# Patient Record
Sex: Female | Born: 1980 | Race: White | Hispanic: No | Marital: Married | Smoking: Former smoker
Health system: Southern US, Community
[De-identification: ages and names within clinical notes are randomized; demographics above are authoritative.]

## PROBLEM LIST (undated history)

## (undated) ENCOUNTER — Inpatient Hospital Stay (HOSPITAL_COMMUNITY): Payer: Self-pay

## (undated) ENCOUNTER — Emergency Department (HOSPITAL_COMMUNITY): Discharge status: Absent without leave | Payer: Managed Care, Other (non HMO)

## (undated) DIAGNOSIS — O139 Gestational [pregnancy-induced] hypertension without significant proteinuria, unspecified trimester: Secondary | ICD-10-CM

## (undated) DIAGNOSIS — Z8619 Personal history of other infectious and parasitic diseases: Secondary | ICD-10-CM

## (undated) DIAGNOSIS — F419 Anxiety disorder, unspecified: Secondary | ICD-10-CM

## (undated) DIAGNOSIS — N189 Chronic kidney disease, unspecified: Secondary | ICD-10-CM

## (undated) DIAGNOSIS — L409 Psoriasis, unspecified: Secondary | ICD-10-CM

## (undated) HISTORY — DX: Anxiety disorder, unspecified: F41.9

## (undated) HISTORY — PX: WISDOM TOOTH EXTRACTION: SHX21

## (undated) HISTORY — DX: Psoriasis, unspecified: L40.9

## (undated) HISTORY — DX: Personal history of other infectious and parasitic diseases: Z86.19

## (undated) HISTORY — PX: OTHER SURGICAL HISTORY: SHX169

---

## 2014-01-25 ENCOUNTER — Inpatient Hospital Stay (HOSPITAL_COMMUNITY)
Admission: AD | Admit: 2014-01-25 | Discharge: 2014-01-25 | Disposition: A | Discharge status: Home / Self Care | Payer: Managed Care, Other (non HMO) | Source: Ambulatory Visit | Attending: Obstetrics and Gynecology | Admitting: Obstetrics and Gynecology

## 2014-01-25 ENCOUNTER — Inpatient Hospital Stay (HOSPITAL_COMMUNITY): Payer: Managed Care, Other (non HMO)

## 2014-01-25 ENCOUNTER — Encounter (HOSPITAL_COMMUNITY): Payer: Self-pay | Admitting: *Deleted

## 2014-01-25 DIAGNOSIS — M545 Low back pain, unspecified: Secondary | ICD-10-CM | POA: Insufficient documentation

## 2014-01-25 DIAGNOSIS — O99891 Other specified diseases and conditions complicating pregnancy: Secondary | ICD-10-CM | POA: Insufficient documentation

## 2014-01-25 DIAGNOSIS — R109 Unspecified abdominal pain: Secondary | ICD-10-CM | POA: Diagnosis not present

## 2014-01-25 DIAGNOSIS — O26891 Other specified pregnancy related conditions, first trimester: Secondary | ICD-10-CM

## 2014-01-25 DIAGNOSIS — O9989 Other specified diseases and conditions complicating pregnancy, childbirth and the puerperium: Principal | ICD-10-CM

## 2014-01-25 DIAGNOSIS — O0281 Inappropriate change in quantitative human chorionic gonadotropin (hCG) in early pregnancy: Secondary | ICD-10-CM

## 2014-01-25 LAB — URINALYSIS, ROUTINE W REFLEX MICROSCOPIC
Bilirubin Urine: NEGATIVE
GLUCOSE, UA: NEGATIVE mg/dL
Hgb urine dipstick: NEGATIVE
KETONES UR: NEGATIVE mg/dL
LEUKOCYTES UA: NEGATIVE
Nitrite: NEGATIVE
Protein, ur: NEGATIVE mg/dL
SPECIFIC GRAVITY, URINE: 1.01 (ref 1.005–1.030)
Urobilinogen, UA: 0.2 mg/dL (ref 0.0–1.0)
pH: 7 (ref 5.0–8.0)

## 2014-01-25 LAB — ABO/RH: ABO/RH(D): O POS

## 2014-01-25 LAB — POCT PREGNANCY, URINE: PREG TEST UR: POSITIVE — AB

## 2014-01-25 LAB — HCG, QUANTITATIVE, PREGNANCY: HCG, BETA CHAIN, QUANT, S: 1509 m[IU]/mL — AB (ref <5)

## 2014-01-25 NOTE — MAU Provider Note (Signed)
History     CSN: [id number]  Arrival date and time: 01/25/14 1412   None     Chief Complaint  Patient presents with   Back Pain   HPI 33 y.o. Carmen Castro @[redacted]w[redacted]d  presents today with 24 hours of Left lower back pain that radiates to her back and left leg.  She has occasional abdominal pain that is relieved if she burps or passes gas.  She has fatigue, dizziness, slight nausea, no vomiting.  No discharge or bleeding.  No LOF.    Tylenol did help her get some sleep last night.   OB History   Grav Para Term Preterm Abortions TAB SAB Ect Mult Living   2 1 1  0 0 0 0 0 0 1      History reviewed. No pertinent past medical history.  History reviewed. No pertinent past surgical history.  History reviewed. No pertinent family history.  History  Substance Use Topics   Smoking status: Never Smoker    Smokeless tobacco: Not on file   Alcohol Use: No    Allergies: No Known Allergies  Prescriptions prior to admission  Medication Sig Dispense Refill   acetaminophen (TYLENOL) 500 MG tablet Take 500 mg by mouth every 6 (six) hours as needed for moderate pain.       folic acid (FOLVITE) 800 MCG tablet Take 800 mcg by mouth daily.       polyethylene glycol (MIRALAX / GLYCOLAX) packet Take 17 g by mouth daily as needed for mild constipation.       Prenatal Vit-Fe Fumarate-FA (PRENATAL MULTIVITAMIN) TABS tablet Take 1 tablet by mouth daily at 12 noon.        Review of Systems  Constitutional: Negative for fever and chills.  Eyes: Negative for blurred vision.  Respiratory: Positive for shortness of breath. Negative for cough.   Cardiovascular: Negative for chest pain and palpitations.  Gastrointestinal: Positive for nausea. Negative for heartburn, vomiting and abdominal pain.  Genitourinary: Negative for dysuria, urgency, frequency and hematuria.  Skin: Negative for rash.  Neurological: Positive for dizziness and headaches. Negative for weakness.   Physical Exam   Blood  pressure 149/76, pulse 86, temperature 98.4 F (36.9 C), temperature source Oral, resp. rate 18, height 6' (1.829 m), weight 82.01 kg (180 lb 12.8 oz), last menstrual period 12/17/2013.  Physical Exam  Constitutional: She is oriented to person, place, and time. She appears well-developed and well-nourished. No distress.  HENT:  Head: Normocephalic and atraumatic.  Eyes: EOM are normal.  Neck: Normal range of motion.  Cardiovascular: Normal rate, regular rhythm and normal heart sounds.   Respiratory: Breath sounds normal. She is in respiratory distress.  GI: Soft. Bowel sounds are normal. She exhibits no distension. There is no tenderness.  Musculoskeletal: Normal range of motion. She exhibits no edema.  Neurological: She is alert and oriented to person, place, and time.  Skin: Skin is warm and dry.  Psychiatric: She has a normal mood and affect.   Results for orders placed during the hospital encounter of 01/25/14 (from the past 24 hour(s))  URINALYSIS, ROUTINE W REFLEX MICROSCOPIC     Status: None   Collection Time    01/25/14  2:50 PM      Result Value Ref Range   Color, Urine YELLOW  YELLOW   APPearance CLEAR  CLEAR   Specific Gravity, Urine 1.010  1.005 - 1.030   pH 7.0  5.0 - 8.0   Glucose, UA NEGATIVE  NEGATIVE mg/dL  Hgb urine dipstick NEGATIVE  NEGATIVE   Bilirubin Urine NEGATIVE  NEGATIVE   Ketones, ur NEGATIVE  NEGATIVE mg/dL   Protein, ur NEGATIVE  NEGATIVE mg/dL   Urobilinogen, UA 0.2  0.0 - 1.0 mg/dL   Nitrite NEGATIVE  NEGATIVE   Leukocytes, UA NEGATIVE  NEGATIVE  POCT PREGNANCY, URINE     Status: Abnormal   Collection Time    01/25/14  2:55 PM      Result Value Ref Range   Preg Test, Ur POSITIVE (*) NEGATIVE  HCG, QUANTITATIVE, PREGNANCY     Status: Abnormal   Collection Time    01/25/14  4:25 PM      Result Value Ref Range   hCG, Beta Chain, Quant, S 1509 (*) <5 mIU/mL  ABO/RH     Status: None   Collection Time    01/25/14  4:25 PM      Result Value Ref  Range   ABO/RH(D) O POS       MAU Course  Procedures  MDM Discussed with Dr. Arelia SneddonMcComb and given plan.  He prefers pt to come to office for serial quant.    Assessment and Plan  Assessment: 1. Low back pain during pregnancy in first trimester   2. Elevated level of quantitative hCG for gestational age in early pregnancy    Plan: Discharge to home Flexeril offered but pt declined Ice/heat alternating on left lower back Tylenol PRN not to exceed 3g/day Schedule appt with Dr. Lisbeth PlyMcComb's office for Friday to follow serial quant.    Duane BostonKaren E Teague Clark 01/25/2014, 3:41 PM

## 2014-01-25 NOTE — MAU Note (Signed)
About 6 wks preg.  Having severe pain on left side low back.  When moves left leg, shoots pain into low back. First apt 08/25. Was told to come here.

## 2014-01-25 NOTE — Discharge Instructions (Signed)

## 2014-02-14 LAB — OB RESULTS CONSOLE ABO/RH: RH Type: POSITIVE

## 2014-02-14 LAB — OB RESULTS CONSOLE HIV ANTIBODY (ROUTINE TESTING): HIV: NONREACTIVE

## 2014-02-14 LAB — OB RESULTS CONSOLE RUBELLA ANTIBODY, IGM: Rubella: UNDETERMINED

## 2014-02-14 LAB — OB RESULTS CONSOLE GC/CHLAMYDIA
Chlamydia: NEGATIVE
GC PROBE AMP, GENITAL: NEGATIVE

## 2014-02-14 LAB — OB RESULTS CONSOLE RPR: RPR: NONREACTIVE

## 2014-02-14 LAB — OB RESULTS CONSOLE HEPATITIS B SURFACE ANTIGEN: Hepatitis B Surface Ag: NEGATIVE

## 2014-02-14 LAB — OB RESULTS CONSOLE ANTIBODY SCREEN: ANTIBODY SCREEN: NEGATIVE

## 2014-04-18 ENCOUNTER — Encounter (HOSPITAL_COMMUNITY): Payer: Self-pay | Admitting: *Deleted

## 2014-07-18 ENCOUNTER — Encounter (HOSPITAL_COMMUNITY): Payer: Self-pay | Admitting: *Deleted

## 2014-07-18 ENCOUNTER — Inpatient Hospital Stay (HOSPITAL_COMMUNITY)
Admission: AD | Admit: 2014-07-18 | Discharge: 2014-07-18 | Disposition: A | Discharge status: Home / Self Care | Payer: Managed Care, Other (non HMO) | Source: Ambulatory Visit | Attending: Obstetrics and Gynecology | Admitting: Obstetrics and Gynecology

## 2014-07-18 DIAGNOSIS — Z87891 Personal history of nicotine dependence: Secondary | ICD-10-CM | POA: Insufficient documentation

## 2014-07-18 DIAGNOSIS — O368130 Decreased fetal movements, third trimester, not applicable or unspecified: Secondary | ICD-10-CM | POA: Diagnosis present

## 2014-07-18 DIAGNOSIS — O26899 Other specified pregnancy related conditions, unspecified trimester: Secondary | ICD-10-CM

## 2014-07-18 DIAGNOSIS — O9989 Other specified diseases and conditions complicating pregnancy, childbirth and the puerperium: Secondary | ICD-10-CM

## 2014-07-18 DIAGNOSIS — O2243 Hemorrhoids in pregnancy, third trimester: Secondary | ICD-10-CM | POA: Diagnosis not present

## 2014-07-18 DIAGNOSIS — Z3A30 30 weeks gestation of pregnancy: Secondary | ICD-10-CM | POA: Insufficient documentation

## 2014-07-18 DIAGNOSIS — R109 Unspecified abdominal pain: Secondary | ICD-10-CM

## 2014-07-18 LAB — URINALYSIS, ROUTINE W REFLEX MICROSCOPIC
Bilirubin Urine: NEGATIVE
Glucose, UA: NEGATIVE mg/dL
Hgb urine dipstick: NEGATIVE
Ketones, ur: NEGATIVE mg/dL
LEUKOCYTES UA: NEGATIVE
NITRITE: NEGATIVE
PH: 6 (ref 5.0–8.0)
PROTEIN: NEGATIVE mg/dL
Specific Gravity, Urine: 1.02 (ref 1.005–1.030)
Urobilinogen, UA: 0.2 mg/dL (ref 0.0–1.0)

## 2014-07-18 LAB — FETAL FIBRONECTIN: Fetal Fibronectin: NEGATIVE

## 2014-07-18 MED ORDER — ACETAMINOPHEN 500 MG PO TABS
1000.0000 mg | ORAL_TABLET | Freq: Once | ORAL | Status: AC
  Administered 2014-07-18: 1000 mg via ORAL
  Filled 2014-07-18: qty 2

## 2014-07-18 NOTE — MAU Note (Signed)
Tylenol resolved patients pain to 1of10. Pt states that she is now feeling the baby move.

## 2014-07-18 NOTE — MAU Note (Signed)
Hx of hemorrhoids, saw blood when used restroom, unsure if recal or vaginal

## 2014-07-18 NOTE — MAU Provider Note (Signed)
History     CSN: [id number]  Arrival date and time: 07/18/14 1843   None     Chief Complaint  Patient presents with  . Abdominal Pain  . Back Pain  . Decreased Fetal Movement   HPIpt is G2P1001  pregnant who presents for decreased fetal movement.  Pt states that baby is usually very active but today has not been.  Pt denies LOF.  Pt had one episode of scant blood with wiping - pt has hemorrhoids and pt thinks her blood was from hemorrhoid. Pt has back pain- has a 12 month old at home- pt has had some left lower quadrant cramping. Pt had induction for hypertension.  Pt has not had any issues with this pregnancy and had normal GTT.  Has been feeling balling up and pressure in lower abd today, pains in low back. Also baby is usually very active, not moving as much today History reviewed. No pertinent past medical history.  Past Surgical History  Procedure Laterality Date  . Wisdom tooth extraction      No family history on file.  History  Substance Use Topics  . Smoking status: Former Games developer  . Smokeless tobacco: Never Used  . Alcohol Use: No    Allergies: No Known Allergies  Prescriptions prior to admission  Medication Sig Dispense Refill Last Dose  . Phenylephrine-APAP-Guaifenesin 10-650-400 MG/20ML LIQD Take 20 mLs by mouth every 4 (four) hours as needed (For sinus pressure.).   07/17/2014 at Unknown time  . Prenatal Vit-Fe Fumarate-FA (PRENATAL MULTIVITAMIN) TABS tablet Take 1 tablet by mouth at bedtime.    07/17/2014 at Unknown time    Review of Systems  Constitutional: Negative for fever and chills.  Gastrointestinal: Positive for abdominal pain. Negative for nausea, vomiting and diarrhea.  Genitourinary: Positive for frequency. Negative for dysuria and urgency.  Musculoskeletal: Positive for back pain.  Neurological: Positive for dizziness. Negative for headaches.   Physical Exam   Blood pressure 146/80, pulse 93, temperature 98 F (36.7 C), temperature  source Oral, resp. rate 17, height 6' (1.829 m), weight 222 lb (100.699 kg), last menstrual period 12/17/2013.  Physical Exam  Vitals reviewed. Constitutional: She is oriented to person, place, and time. She appears well-developed and well-nourished. No distress.  HENT:  Head: Normocephalic.  Eyes: Pupils are equal, round, and reactive to light.  Neck: Normal range of motion. Neck supple.  Cardiovascular: Normal rate.   Recheck BP 126/70  Respiratory: Effort normal.  GI: Soft.  Genitourinary:  Small amount of mucous white vaginal discharge; cervix parous, FT, long, NT- no evidence of any blood  Musculoskeletal: Normal range of motion.  Neurological: She is alert and oriented to person, place, and time.  Skin: Skin is warm and dry.  Psychiatric: She has a normal mood and affect.    MAU Course  Procedures  Pt placed on monitor- still not feeling baby move LM for Dr. Renaldo Fiddler to call (inadvertantly- Dr. Marcelle Overlie on call) Dr. Marcelle Overlie contacted- pt has reactive NST- no ctx noted Results for orders placed or performed during the hospital encounter of 07/18/14 (from the past 24 hour(s))  Urinalysis, Routine w reflex microscopic     Status: None   Collection Time: 07/18/14  6:55 PM  Result Value Ref Range   Color, Urine YELLOW YELLOW   APPearance CLEAR CLEAR   Specific Gravity, Urine 1.020 1.005 - 1.030   pH 6.0 5.0 - 8.0   Glucose, UA NEGATIVE NEGATIVE mg/dL   Hgb urine dipstick NEGATIVE NEGATIVE  Bilirubin Urine NEGATIVE NEGATIVE   Ketones, ur NEGATIVE NEGATIVE mg/dL   Protein, ur NEGATIVE NEGATIVE mg/dL   Urobilinogen, UA 0.2 0.0 - 1.0 mg/dL   Nitrite NEGATIVE NEGATIVE   Leukocytes, UA NEGATIVE NEGATIVE  Fetal fibronectin     Status: None   Collection Time: 07/18/14  8:00 PM  Result Value Ref Range   Fetal Fibronectin NEGATIVE NEGATIVE  pt given apple juice and pt started feeling baby move more Tylenol given for back pain- some relief Discussed with pt comfort measures and  results of labs Will d/c pt and have do kick counts   Assessment and Plan  Abdominal pain in pregnancy- neg fetal fibronectin Decreased fetal movement F/u with OB appointment  Carmen Castro 07/18/2014, 7:34 PM

## 2014-07-18 NOTE — MAU Note (Signed)
Urine in lab 

## 2014-07-18 NOTE — MAU Note (Signed)
Has been feeling balling up and pressure in lower abd today, pains in low back.  Also baby is usually very active, not moving as much today.

## 2014-07-18 NOTE — Discharge Instructions (Signed)

## 2014-07-31 ENCOUNTER — Encounter (HOSPITAL_COMMUNITY): Payer: Self-pay | Admitting: *Deleted

## 2014-07-31 ENCOUNTER — Inpatient Hospital Stay (HOSPITAL_COMMUNITY)
Admission: AD | Admit: 2014-07-31 | Discharge: 2014-07-31 | Disposition: A | Discharge status: Home / Self Care | Payer: Managed Care, Other (non HMO) | Source: Ambulatory Visit | Attending: Obstetrics and Gynecology | Admitting: Obstetrics and Gynecology

## 2014-07-31 DIAGNOSIS — O9989 Other specified diseases and conditions complicating pregnancy, childbirth and the puerperium: Secondary | ICD-10-CM | POA: Insufficient documentation

## 2014-07-31 DIAGNOSIS — K529 Noninfective gastroenteritis and colitis, unspecified: Secondary | ICD-10-CM | POA: Insufficient documentation

## 2014-07-31 DIAGNOSIS — Z87891 Personal history of nicotine dependence: Secondary | ICD-10-CM | POA: Insufficient documentation

## 2014-07-31 DIAGNOSIS — O212 Late vomiting of pregnancy: Secondary | ICD-10-CM | POA: Diagnosis present

## 2014-07-31 DIAGNOSIS — Z3A32 32 weeks gestation of pregnancy: Secondary | ICD-10-CM | POA: Insufficient documentation

## 2014-07-31 HISTORY — DX: Chronic kidney disease, unspecified: N18.9

## 2014-07-31 HISTORY — DX: Gestational (pregnancy-induced) hypertension without significant proteinuria, unspecified trimester: O13.9

## 2014-07-31 LAB — URINALYSIS, ROUTINE W REFLEX MICROSCOPIC
Bilirubin Urine: NEGATIVE
GLUCOSE, UA: NEGATIVE mg/dL
Hgb urine dipstick: NEGATIVE
Ketones, ur: >80 mg/dL — AB
Leukocytes, UA: NEGATIVE
Nitrite: NEGATIVE
PROTEIN: NEGATIVE mg/dL
Specific Gravity, Urine: >1.03 — ABNORMAL HIGH (ref 1.005–1.030)
Urobilinogen, UA: 0.2 mg/dL (ref 0.0–1.0)
pH: 6 (ref 5.0–8.0)

## 2014-07-31 MED ORDER — DEXTROSE IN LACTATED RINGERS 5 % IV SOLN
Freq: Once | INTRAVENOUS | Status: AC
  Administered 2014-07-31: 17:00:00 via INTRAVENOUS

## 2014-07-31 MED ORDER — SODIUM CHLORIDE 0.9 % IV SOLN
25.0000 mg | Freq: Once | INTRAVENOUS | Status: AC
  Administered 2014-07-31: 25 mg via INTRAVENOUS
  Filled 2014-07-31: qty 1

## 2014-07-31 NOTE — MAU Provider Note (Signed)
  History     CSN: [id number]  Arrival date and time: 07/31/14 1500   First Provider Initiated Contact with Patient 07/31/14 1543      No chief complaint on file.  HPI This is a 34 y.o. female at 9361w2d who presents with c/o vomiting since 4am, about 10-12 times. Feels dehydrated. Tried Phenergan at home but vomited it up. Denies fever or diarrhea. Denies leaking, bleeding or contractions.   OB History    Gravida Para Term Preterm AB TAB SAB Ectopic Multiple Living   2 1 1  0 0 0 0 0 0 1      Past Medical History  Diagnosis Date  . Pregnancy induced hypertension     with first pregnancy  . Chronic kidney disease     uti's as a child    Past Surgical History  Procedure Laterality Date  . Wisdom tooth extraction      History reviewed. No pertinent family history.  History  Substance Use Topics  . Smoking status: Former Games developermoker  . Smokeless tobacco: Never Used  . Alcohol Use: No    Allergies: No Known Allergies  Prescriptions prior to admission  Medication Sig Dispense Refill Last Dose  . Phenylephrine-APAP-Guaifenesin 10-650-400 MG/20ML LIQD Take 20 mLs by mouth every 4 (four) hours as needed (For sinus pressure.).   Past Month at Unknown time  . Prenatal Vit-Fe Fumarate-FA (PRENATAL MULTIVITAMIN) TABS tablet Take 1 tablet by mouth at bedtime.    07/30/2014 at Unknown time  . promethazine (PHENERGAN) 25 MG tablet Take 25 mg by mouth every 6 (six) hours as needed for nausea or vomiting.   07/31/2014 at 1245    Review of Systems  Constitutional: Positive for malaise/fatigue. Negative for fever and chills.  Gastrointestinal: Positive for nausea, vomiting and abdominal pain (occasional cramps). Negative for diarrhea and constipation.  Neurological: Positive for weakness. Negative for dizziness.   Physical Exam   Blood pressure 117/63, pulse 149, temperature 99.2 F (37.3 C), temperature source Oral, resp. rate 16, last menstrual period 12/17/2013.  Physical Exam   Constitutional: She is oriented to person, place, and time. She appears well-developed and well-nourished. No distress (but ill-appearing).  HENT:  Head: Normocephalic.  Cardiovascular: Normal rate.   Respiratory: Effort normal.  GI: Soft. She exhibits no distension and no mass. There is no tenderness. There is no rebound and no guarding.  Musculoskeletal: Normal range of motion.  Neurological: She is alert and oriented to person, place, and time.  Skin: Skin is warm and dry.  Psychiatric: She has a normal mood and affect.    MAU Course  Procedures  MDM Given IV hydration x 2 liters with Phenergan. Felt much better afterward, though still a bit nauseated. Able to tolerate POs  Assessment and Plan  A:  SIUP at 5645w4d      Gastroenteritis  P;  Discharge home       BRAT diet       Keep using Phenergan prn.       Call if persists beyond a few days  [facility] TallahasseeWILLIAMS,MARIE 07/31/2014, 4:21 PM

## 2014-07-31 NOTE — Discharge Instructions (Signed)

## 2014-08-02 ENCOUNTER — Encounter (HOSPITAL_COMMUNITY): Payer: Self-pay | Admitting: Advanced Practice Midwife

## 2014-08-22 LAB — OB RESULTS CONSOLE GBS: STREP GROUP B AG: NEGATIVE

## 2014-09-07 ENCOUNTER — Telehealth (HOSPITAL_COMMUNITY): Payer: Self-pay | Admitting: *Deleted

## 2014-09-07 ENCOUNTER — Encounter (HOSPITAL_COMMUNITY): Payer: Self-pay | Admitting: *Deleted

## 2014-09-07 NOTE — Telephone Encounter (Signed)
Preadmission screen  

## 2014-09-13 ENCOUNTER — Encounter (HOSPITAL_COMMUNITY): Payer: Self-pay

## 2014-09-13 ENCOUNTER — Inpatient Hospital Stay (HOSPITAL_COMMUNITY)
Admission: RE | Admit: 2014-09-13 | Discharge: 2014-09-15 | DRG: 775 | Disposition: A | Discharge status: Home / Self Care | Payer: Managed Care, Other (non HMO) | Source: Ambulatory Visit | Attending: Obstetrics and Gynecology | Admitting: Obstetrics and Gynecology

## 2014-09-13 ENCOUNTER — Inpatient Hospital Stay (HOSPITAL_COMMUNITY): Payer: Managed Care, Other (non HMO) | Admitting: Anesthesiology

## 2014-09-13 DIAGNOSIS — O26833 Pregnancy related renal disease, third trimester: Secondary | ICD-10-CM | POA: Diagnosis present

## 2014-09-13 DIAGNOSIS — O3663X0 Maternal care for excessive fetal growth, third trimester, not applicable or unspecified: Secondary | ICD-10-CM | POA: Diagnosis present

## 2014-09-13 DIAGNOSIS — Z3490 Encounter for supervision of normal pregnancy, unspecified, unspecified trimester: Secondary | ICD-10-CM

## 2014-09-13 DIAGNOSIS — Z833 Family history of diabetes mellitus: Secondary | ICD-10-CM | POA: Diagnosis not present

## 2014-09-13 DIAGNOSIS — Z3A39 39 weeks gestation of pregnancy: Secondary | ICD-10-CM | POA: Diagnosis present

## 2014-09-13 DIAGNOSIS — N189 Chronic kidney disease, unspecified: Secondary | ICD-10-CM | POA: Diagnosis present

## 2014-09-13 DIAGNOSIS — Z87891 Personal history of nicotine dependence: Secondary | ICD-10-CM | POA: Diagnosis not present

## 2014-09-13 LAB — CBC
HCT: 36.8 % (ref 36.0–46.0)
HEMATOCRIT: 38.5 % (ref 36.0–46.0)
Hemoglobin: 13.1 g/dL (ref 12.0–15.0)
Hemoglobin: 12.5 g/dL (ref 12.0–15.0)
MCH: 31.3 pg (ref 26.0–34.0)
MCH: 31.2 pg (ref 26.0–34.0)
MCHC: 34 g/dL (ref 30.0–36.0)
MCHC: 34 g/dL (ref 30.0–36.0)
MCV: 92.1 fL (ref 78.0–100.0)
MCV: 91.8 fL (ref 78.0–100.0)
Platelets: 227 10*3/uL (ref 150–400)
Platelets: 191 10*3/uL (ref 150–400)
RBC: 4.18 MIL/uL (ref 3.87–5.11)
RBC: 4.01 MIL/uL (ref 3.87–5.11)
RDW: 14.6 % (ref 11.5–15.5)
RDW: 14.2 % (ref 11.5–15.5)
WBC: 12.7 10*3/uL — ABNORMAL HIGH (ref 4.0–10.5)
WBC: 19.1 10*3/uL — ABNORMAL HIGH (ref 4.0–10.5)

## 2014-09-13 LAB — TYPE AND SCREEN
ABO/RH(D): O POS
ANTIBODY SCREEN: NEGATIVE

## 2014-09-13 LAB — RPR: RPR: NONREACTIVE

## 2014-09-13 MED ORDER — FENTANYL 2.5 MCG/ML BUPIVACAINE 1/10 % EPIDURAL INFUSION (WH - ANES)
14.0000 mL/h | INTRAMUSCULAR | Status: DC | PRN
  Administered 2014-09-13 (×2): 14 mL/h via EPIDURAL
  Filled 2014-09-13 (×2): qty 125

## 2014-09-13 MED ORDER — DIBUCAINE 1 % RE OINT: 1.0000 "application " | TOPICAL_OINTMENT | RECTAL | Status: DC | PRN

## 2014-09-13 MED ORDER — ONDANSETRON HCL 4 MG/2ML IJ SOLN: 4.0000 mg | Freq: Four times a day (QID) | INTRAMUSCULAR | Status: DC | PRN

## 2014-09-13 MED ORDER — EPHEDRINE 5 MG/ML INJ
10.0000 mg | INTRAVENOUS | Status: DC | PRN
  Filled 2014-09-13: qty 2

## 2014-09-13 MED ORDER — ONDANSETRON HCL 4 MG PO TABS: 4.0000 mg | ORAL_TABLET | ORAL | Status: DC | PRN

## 2014-09-13 MED ORDER — DIPHENHYDRAMINE HCL 25 MG PO CAPS: 25.0000 mg | ORAL_CAPSULE | Freq: Four times a day (QID) | ORAL | Status: DC | PRN

## 2014-09-13 MED ORDER — PRENATAL MULTIVITAMIN CH
1.0000 | ORAL_TABLET | Freq: Every day | ORAL | Status: DC
  Administered 2014-09-14: 1 via ORAL
  Filled 2014-09-13: qty 1

## 2014-09-13 MED ORDER — OXYCODONE-ACETAMINOPHEN 5-325 MG PO TABS
1.0000 | ORAL_TABLET | ORAL | Status: DC | PRN
  Administered 2014-09-14 (×5): 1 via ORAL
  Filled 2014-09-13 (×5): qty 1

## 2014-09-13 MED ORDER — ACETAMINOPHEN 325 MG PO TABS: 650.0000 mg | ORAL_TABLET | ORAL | Status: DC | PRN

## 2014-09-13 MED ORDER — LIDOCAINE HCL (PF) 1 % IJ SOLN
INTRAMUSCULAR | Status: DC | PRN
  Administered 2014-09-13: 4 mL
  Administered 2014-09-13: 6 mL

## 2014-09-13 MED ORDER — ONDANSETRON HCL 4 MG/2ML IJ SOLN: 4.0000 mg | INTRAMUSCULAR | Status: DC | PRN

## 2014-09-13 MED ORDER — WITCH HAZEL-GLYCERIN EX PADS: 1.0000 "application " | MEDICATED_PAD | CUTANEOUS | Status: DC | PRN

## 2014-09-13 MED ORDER — IBUPROFEN 600 MG PO TABS
600.0000 mg | ORAL_TABLET | Freq: Four times a day (QID) | ORAL | Status: DC
  Administered 2014-09-13 – 2014-09-15 (×7): 600 mg via ORAL
  Filled 2014-09-13 (×7): qty 1

## 2014-09-13 MED ORDER — FLEET ENEMA 7-19 GM/118ML RE ENEM: 1.0000 | ENEMA | Freq: Every day | RECTAL | Status: DC | PRN

## 2014-09-13 MED ORDER — LACTATED RINGERS IV SOLN
INTRAVENOUS | Status: DC
  Administered 2014-09-13 (×2): via INTRAVENOUS

## 2014-09-13 MED ORDER — OXYTOCIN BOLUS FROM INFUSION: 500.0000 mL | INTRAVENOUS | Status: DC

## 2014-09-13 MED ORDER — LACTATED RINGERS IV SOLN
500.0000 mL | Freq: Once | INTRAVENOUS | Status: AC
  Administered 2014-09-13: 500 mL via INTRAVENOUS

## 2014-09-13 MED ORDER — OXYTOCIN 40 UNITS IN LACTATED RINGERS INFUSION - SIMPLE MED
1.0000 m[IU]/min | INTRAVENOUS | Status: DC
  Administered 2014-09-13: 2 m[IU]/min via INTRAVENOUS
  Filled 2014-09-13: qty 1000

## 2014-09-13 MED ORDER — TETANUS-DIPHTH-ACELL PERTUSSIS 5-2.5-18.5 LF-MCG/0.5 IM SUSP: 0.5000 mL | Freq: Once | INTRAMUSCULAR | Status: DC

## 2014-09-13 MED ORDER — DIPHENHYDRAMINE HCL 50 MG/ML IJ SOLN: 12.5000 mg | INTRAMUSCULAR | Status: DC | PRN

## 2014-09-13 MED ORDER — FENTANYL 2.5 MCG/ML BUPIVACAINE 1/10 % EPIDURAL INFUSION (WH - ANES): 14.0000 mL/h | INTRAMUSCULAR | Status: DC | PRN

## 2014-09-13 MED ORDER — LIDOCAINE HCL (PF) 1 % IJ SOLN
30.0000 mL | INTRAMUSCULAR | Status: DC | PRN
  Administered 2014-09-13: 30 mL via SUBCUTANEOUS
  Filled 2014-09-13: qty 30

## 2014-09-13 MED ORDER — OXYCODONE-ACETAMINOPHEN 5-325 MG PO TABS: 2.0000 | ORAL_TABLET | ORAL | Status: DC | PRN

## 2014-09-13 MED ORDER — TERBUTALINE SULFATE 1 MG/ML IJ SOLN
0.2500 mg | Freq: Once | INTRAMUSCULAR | Status: AC | PRN
  Filled 2014-09-13: qty 1

## 2014-09-13 MED ORDER — LACTATED RINGERS IV SOLN: 500.0000 mL | INTRAVENOUS | Status: DC | PRN

## 2014-09-13 MED ORDER — PHENYLEPHRINE 40 MCG/ML (10ML) SYRINGE FOR IV PUSH (FOR BLOOD PRESSURE SUPPORT)
80.0000 ug | PREFILLED_SYRINGE | INTRAVENOUS | Status: DC | PRN
  Filled 2014-09-13: qty 2
  Filled 2014-09-13: qty 20

## 2014-09-13 MED ORDER — OXYTOCIN 40 UNITS IN LACTATED RINGERS INFUSION - SIMPLE MED
62.5000 mL/h | INTRAVENOUS | Status: DC
  Administered 2014-09-13: 62.5 mL/h via INTRAVENOUS

## 2014-09-13 MED ORDER — BISACODYL 10 MG RE SUPP: 10.0000 mg | Freq: Every day | RECTAL | Status: DC | PRN

## 2014-09-13 MED ORDER — LANOLIN HYDROUS EX OINT: TOPICAL_OINTMENT | CUTANEOUS | Status: DC | PRN

## 2014-09-13 MED ORDER — TERBUTALINE SULFATE 1 MG/ML IJ SOLN
0.2500 mg | Freq: Once | INTRAMUSCULAR | Status: DC | PRN
  Filled 2014-09-13: qty 1

## 2014-09-13 MED ORDER — CITRIC ACID-SODIUM CITRATE 334-500 MG/5ML PO SOLN: 30.0000 mL | ORAL | Status: DC | PRN

## 2014-09-13 MED ORDER — BENZOCAINE-MENTHOL 20-0.5 % EX AERO
1.0000 "application " | INHALATION_SPRAY | CUTANEOUS | Status: DC | PRN
  Administered 2014-09-13: 1 via TOPICAL
  Filled 2014-09-13: qty 56

## 2014-09-13 MED ORDER — SENNOSIDES-DOCUSATE SODIUM 8.6-50 MG PO TABS
2.0000 | ORAL_TABLET | ORAL | Status: DC
  Administered 2014-09-14: 2 via ORAL
  Filled 2014-09-13 (×2): qty 2

## 2014-09-13 MED ORDER — OXYTOCIN 40 UNITS IN LACTATED RINGERS INFUSION - SIMPLE MED: 1.0000 m[IU]/min | INTRAVENOUS | Status: DC

## 2014-09-13 MED ORDER — SIMETHICONE 80 MG PO CHEW
80.0000 mg | CHEWABLE_TABLET | ORAL | Status: DC | PRN
  Administered 2014-09-14: 80 mg via ORAL
  Filled 2014-09-13: qty 1

## 2014-09-13 MED ORDER — ZOLPIDEM TARTRATE 5 MG PO TABS: 5.0000 mg | ORAL_TABLET | Freq: Every evening | ORAL | Status: DC | PRN

## 2014-09-13 MED ORDER — OXYCODONE-ACETAMINOPHEN 5-325 MG PO TABS
1.0000 | ORAL_TABLET | ORAL | Status: DC | PRN
Start: 2014-09-13 — End: 2014-09-13

## 2014-09-13 MED ORDER — PHENYLEPHRINE 40 MCG/ML (10ML) SYRINGE FOR IV PUSH (FOR BLOOD PRESSURE SUPPORT)
80.0000 ug | PREFILLED_SYRINGE | INTRAVENOUS | Status: DC | PRN
  Filled 2014-09-13: qty 2

## 2014-09-13 NOTE — H&P (Signed)
Carmen Castro is a 34 y.o. female presenting for IOL at 39 weeks for large efw.  Last sonogram 8.3 pounds.  favorabale ceevix.  Negative GBS. Maternal Medical History:  Fetal activity: Perceived fetal activity is normal.    Prenatal complications: no prenatal complications Prenatal Complications - Diabetes: none.    OB History    Gravida Para Term Preterm AB TAB SAB Ectopic Multiple Living   2 1 1  0 0 0 0 0 0 1     Past Medical History  Diagnosis Date  . Pregnancy induced hypertension     with first pregnancy  . Chronic kidney disease     uti's as a child  . Hx of varicella   . Psoriasis   . Anxiety     mild pp no meds   Past Surgical History  Procedure Laterality Date  . Wisdom tooth extraction    . Urethra stretched      x2   Family History: family history includes Cancer in her maternal grandmother; Diabetes in her maternal uncle; Thyroid disease in her mother. Social History:  reports that she has quit smoking. She has never used smokeless tobacco. She reports that she does not drink alcohol or use illicit drugs.   Prenatal Transfer Tool  Maternal Diabetes: No Genetic Screening: Normal Maternal Ultrasounds/Referrals: Abnormal:  Findings:   Fetal Kidney Anomalies pylectasis Fetal Ultrasounds or other Referrals:  None Maternal Substance Abuse:  No Significant Maternal Medications:  None Significant Maternal Lab Results:  None Other Comments:  None  ROS    Blood pressure 139/86, pulse 99, resp. rate 18, height 6' (1.829 m), weight 235 lb (106.595 kg), last menstrual period 12/17/2013. Maternal Exam:  Uterine Assessment: Contraction strength is mild.  Contraction frequency is irregular.   Abdomen: Patient reports no abdominal tenderness. Estimated fetal weight is 8.3.   Fetal presentation: vertex  Introitus: Amniotic fluid character: clear.  Pelvis: adequate for delivery.   Cervix: Cervix evaluated by digital exam.   3cm and long vtx -2  Fetal Exam Fetal  State Assessment: Category I - tracings are normal.     Physical Exam  Prenatal labs: ABO, Rh: O/Positive/-- (09/01 0000) Antibody: Negative (09/01 0000) Rubella: Equivocal (09/01 0000) RPR: Nonreactive (09/01 0000)  HBsAg: Negative (09/01 0000)  HIV: Non-reactive (09/01 0000)  GBS:     Assessment/Plan: IUP at 39 large efw for IOL Risk of pitocin discussed   Carmen Castro S 09/13/2014, 8:58 AM

## 2014-09-13 NOTE — Progress Notes (Signed)
Delivery of live viable female by Dr Arelia SneddonMcComb. APGARS 9,9

## 2014-09-13 NOTE — Anesthesia Preprocedure Evaluation (Signed)
Anesthesia Evaluation  Patient identified by MRN, date of birth, ID band Patient awake    Reviewed: Allergy & Precautions, H&P , Patient's Chart, lab work & pertinent test results  Airway Mallampati: II  TM Distance: >3 FB Neck ROM: full    Dental  (+) Teeth Intact   Pulmonary former smoker,  breath sounds clear to auscultation        Cardiovascular hypertension, Rhythm:regular Rate:Normal     Neuro/Psych    GI/Hepatic   Endo/Other    Renal/GU      Musculoskeletal   Abdominal   Peds  Hematology   Anesthesia Other Findings   Pregnancy induced hypertension    Reproductive/Obstetrics (+) Pregnancy                             Anesthesia Physical Anesthesia Plan  ASA: II  Anesthesia Plan: Epidural   Post-op Pain Management:    Induction:   Airway Management Planned:   Additional Equipment:   Intra-op Plan:   Post-operative Plan:   Informed Consent: I have reviewed the patients History and Physical, chart, labs and discussed the procedure including the risks, benefits and alternatives for the proposed anesthesia with the patient or authorized representative who has indicated his/her understanding and acceptance.   Dental Advisory Given  Plan Discussed with:   Anesthesia Plan Comments: (Labs checked- platelets confirmed with RN in room. Fetal heart tracing, per RN, reported to be stable enough for sitting procedure. Discussed epidural, and patient consents to the procedure:  included risk of possible headache,backache, failed block, allergic reaction, and nerve injury. This patient was asked if she had any questions or concerns before the procedure started.)        Anesthesia Quick Evaluation

## 2014-09-13 NOTE — Anesthesia Procedure Notes (Signed)

## 2014-09-14 ENCOUNTER — Encounter (HOSPITAL_COMMUNITY): Payer: Self-pay

## 2014-09-14 ENCOUNTER — Inpatient Hospital Stay (HOSPITAL_COMMUNITY): Admission: RE | Admit: 2014-09-14 | Payer: Managed Care, Other (non HMO) | Source: Ambulatory Visit

## 2014-09-14 LAB — CBC
HCT: 33.5 % — ABNORMAL LOW (ref 36.0–46.0)
Hemoglobin: 11.5 g/dL — ABNORMAL LOW (ref 12.0–15.0)
MCH: 31.6 pg (ref 26.0–34.0)
MCHC: 34.3 g/dL (ref 30.0–36.0)
MCV: 92 fL (ref 78.0–100.0)
PLATELETS: 175 10*3/uL (ref 150–400)
RBC: 3.64 MIL/uL — ABNORMAL LOW (ref 3.87–5.11)
RDW: 14.4 % (ref 11.5–15.5)
WBC: 16.2 10*3/uL — ABNORMAL HIGH (ref 4.0–10.5)

## 2014-09-14 NOTE — Progress Notes (Signed)
Post Partum Day 1 Subjective: no complaints, up ad lib, voiding and tolerating PO  Objective: Blood pressure 118/52, pulse 80, temperature 98.7 F (37.1 C), temperature source Oral, resp. rate 18, height 6' (1.829 m), weight 235 lb (106.595 kg), last menstrual period 12/17/2013, SpO2 99 %, unknown if currently breastfeeding.  Physical Exam:  General: alert and cooperative Lochia: appropriate Uterine Fundus: firm Incision: healing well DVT Evaluation: No evidence of DVT seen on physical exam. Negative Homan's sign. No cords or calf tenderness. No significant calf/ankle edema.   Recent Labs  09/13/14 1905 09/14/14 0535  HGB 12.5 11.5*  HCT 36.8 33.5*    Assessment/Plan: Plan for discharge tomorrow and Circumcision prior to discharge   LOS: 1 day   CURTIS,CAROL G 09/14/2014, 8:02 AM

## 2014-09-14 NOTE — Anesthesia Postprocedure Evaluation (Signed)
  Anesthesia Post Note  Patient: Carmen Castro  Procedure(s) Performed: * No procedures listed *  Anesthesia type: Epidural  Patient location: Mother/Baby  Post pain: Pain level controlled  Post assessment: Post-op Vital signs reviewed  Last Vitals:  Filed Vitals:   09/14/14 0200  BP: 118/52  Pulse: 80  Temp: 37.1 C  Resp: 18    Post vital signs: Reviewed  Level of consciousness:alert  Complications: No apparent anesthesia complications

## 2014-09-14 NOTE — Lactation Note (Signed)
This note was copied from the chart of Carmen Castro Ruta. Lactation Consultation Note Experienced BF mom BF her first child for 12 months. She weaned her first child 9 months ago. She states that BF is going great with her newborn. Mom has a pump at home. Baby just had his bath and is doing STS and sleeping. Discussed positions w/newborns, hearing swallows, stimulation to BF, I&O, benefits of colostrum. Mom stated she had a huge amount of milk with her first child. Discussed forceful letdown and engorgement management.  Mom encouraged to feed baby 8-12 times/24 hours and with feeding cues. Mom encouraged to do skin-to-skin. Mom encouraged to waken baby for feeds.  Educated about newborn behavior. Encouraged comfort during BF so colostrum flows better and mom will enjoy the feeding longer. Taking deep breaths and breast massage during BF. Referred to Baby and Me Book in Breastfeeding section Pg. 22-23 for position options and Proper latch demonstration. WH/LC brochure given w/resources, support groups and LC services. Mom is excited about the support groups and will try to attend some.  Patient Name: Carmen Castro Hilmer [id number]'s Date: 09/14/2014 Reason for consult: Initial assessment   Maternal Data Has patient been taught Hand Expression?: Yes Does the patient have breastfeeding experience prior to this delivery?: Yes  Feeding    LATCH Score/Interventions                      Lactation Tools Discussed/Used     Consult Status Consult Status: Follow-up Date: 09/15/14 Follow-up type: In-patient    Charyl DancerCARVER, Inetha Maret G 09/14/2014, 5:37 AM

## 2014-09-14 NOTE — Progress Notes (Signed)
Patient counseled for circ including risk of bleeding, infection, and scarring.  All questions were answered and patient wishes to proceed.    Mitchel HonourMegan Brietta Manso, DO

## 2014-09-15 MED ORDER — OXYCODONE-ACETAMINOPHEN 5-325 MG PO TABS: 1.0000 | ORAL_TABLET | ORAL | Status: DC | PRN

## 2014-09-15 MED ORDER — IBUPROFEN 600 MG PO TABS: 600.0000 mg | ORAL_TABLET | Freq: Four times a day (QID) | ORAL | Status: DC

## 2014-09-15 MED ORDER — MEASLES, MUMPS & RUBELLA VAC ~~LOC~~ INJ
0.5000 mL | INJECTION | Freq: Once | SUBCUTANEOUS | Status: AC
  Administered 2014-09-15: 0.5 mL via SUBCUTANEOUS
  Filled 2014-09-15 (×2): qty 0.5

## 2014-09-15 NOTE — Progress Notes (Signed)
MOB was referred for history of depression/anxiety.  Referral is screened out by Clinical Social Worker because none of the following criteria appear to apply: -History of anxiety/depression during this pregnancy, or of post-partum depression. - Diagnosis of anxiety and/or depression within last 3 years - History of depression due to pregnancy loss/loss of child or -MOB's symptoms are currently being treated with medication and/or therapy.  CSW briefly met with MOB and FOB, but did not complete full assessment.   MOB smiled and displayed a full range in affect as she expressed excitement about returning home.  She stated that she experienced mild postpartum anxiety, but she was informed by her provider that her anxiety was within normal range for a new mother. MOB denied need for treatment after her first child was born. She denied anxiety during the pregnancy and denied any anxiety as she prepares to discharge home. She stated that she has strong/postive family support, and agreed to contact medical provider if postpartum depression/anxiety occur.  Please contact the Clinical Social Worker if needs arise or upon MOB request.  

## 2014-09-15 NOTE — Discharge Summary (Signed)
Obstetric Discharge Summary Reason for Admission: induction of labor Prenatal Procedures: ultrasound Intrapartum Procedures: spontaneous vaginal delivery Postpartum Procedures: none Complications-Operative and Postpartum: 2 degree perineal laceration HEMOGLOBIN  Date Value Ref Range Status  09/14/2014 11.5* 12.0 - 15.0 g/dL Final   HCT  Date Value Ref Range Status  09/14/2014 33.5* 36.0 - 46.0 % Final    Physical Exam:  General: alert and cooperative Lochia: appropriate Uterine Fundus: firm Incision: healing well DVT Evaluation: No evidence of DVT seen on physical exam. Negative Homan's sign. No cords or calf tenderness. No significant calf/ankle edema.  Discharge Diagnoses: Term Pregnancy-delivered  Discharge Information: Date: 09/15/2014 Activity: pelvic rest Diet: routine Medications: PNV, Ibuprofen, Colace and Percocet Condition: stable Instructions: refer to practice specific booklet Discharge to: home   Newborn Data: Live born female  Birth Weight: 8 lb 13.8 oz (4020 g) APGAR: 9, 9  Home with mother.  Meris Reede G 09/15/2014, 8:40 AM

## 2014-09-23 ENCOUNTER — Inpatient Hospital Stay (HOSPITAL_COMMUNITY)
Admission: AD | Admit: 2014-09-23 | Payer: Managed Care, Other (non HMO) | Source: Ambulatory Visit | Admitting: Obstetrics and Gynecology

## 2015-02-23 IMAGING — US US OB COMP LESS 14 WK
1 series · 14 of 28 positions shown · non-contrast
Comparison: None.

CLINICAL DATA: Confirm IUP cine: Beta HCG 06/24/2007; left lower
quadrant pain since last night ; last menstrual period December 17, 2013

EXAM:
OBSTETRIC <14 WK US AND TRANSVAGINAL OB US
TECHNIQUE: Both transabdominal and transvaginal ultrasound examinations were
performed for complete evaluation of the gestation as well as the
maternal uterus, adnexal regions, and pelvic cul-de-sac.
Transvaginal technique was performed to assess early pregnancy.

[Series 1: us ob comp less 14 wks · 14 of 48 slices shown]
[im 2/48]
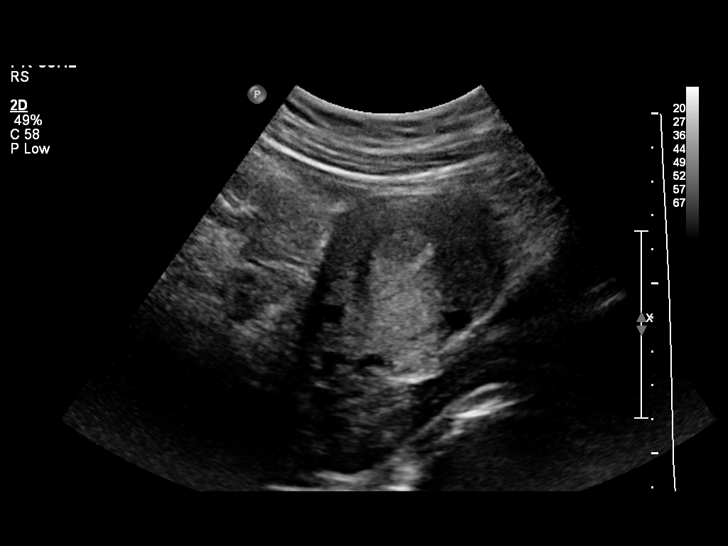
[im 6/48]
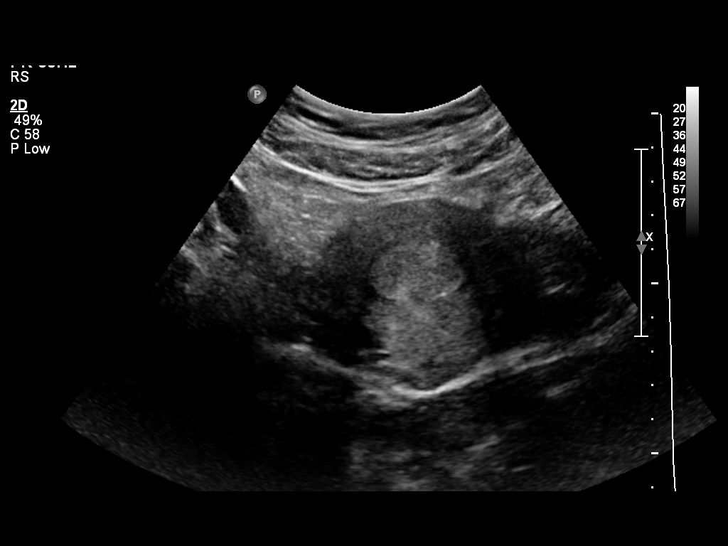
[im 9/48]
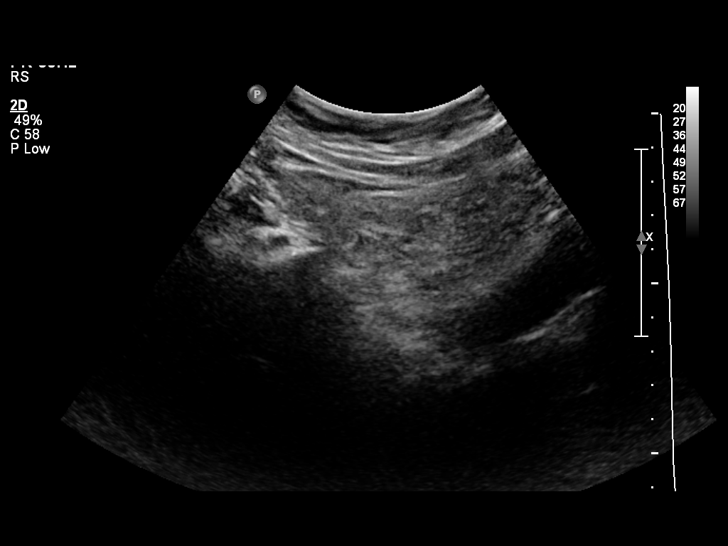
[im 13/48]
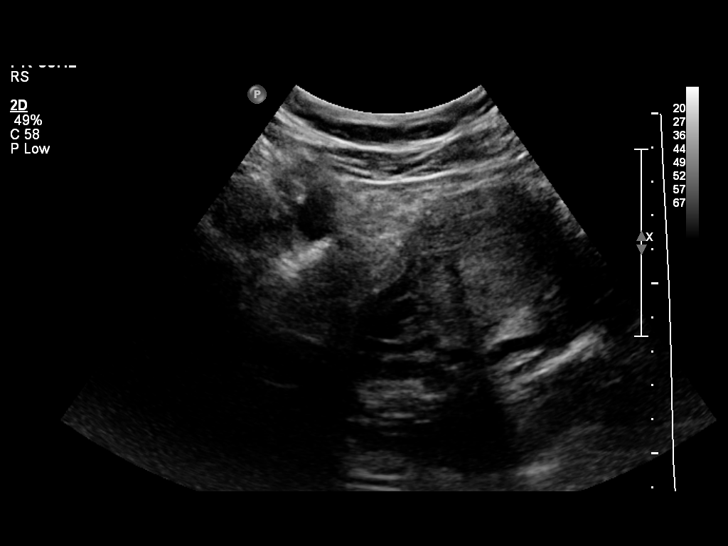
[im 16/48]
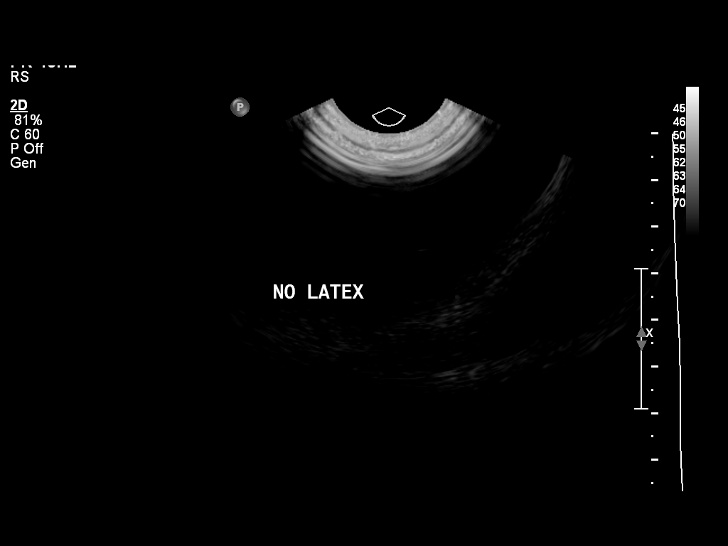
[im 20/48]
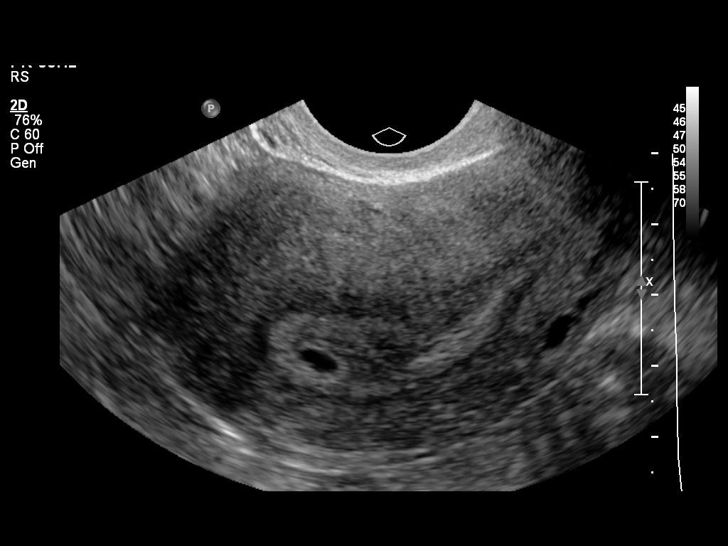
[im 23/48]
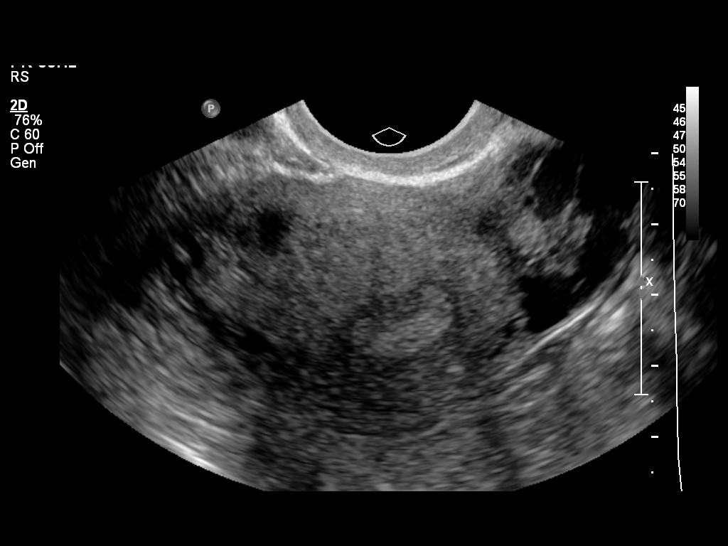
[im 27/48]
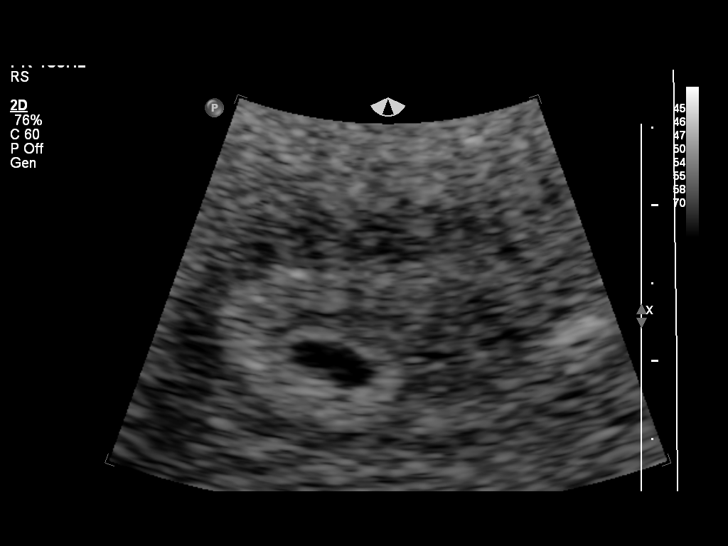
[im 30/48]
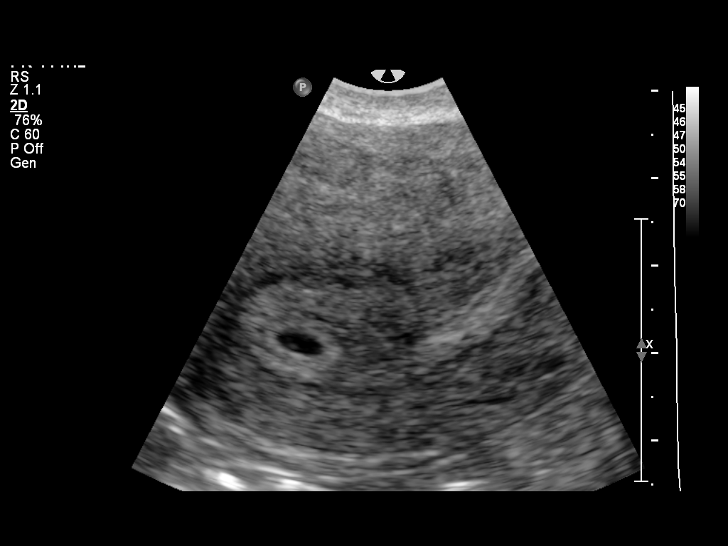
[im 34/48]
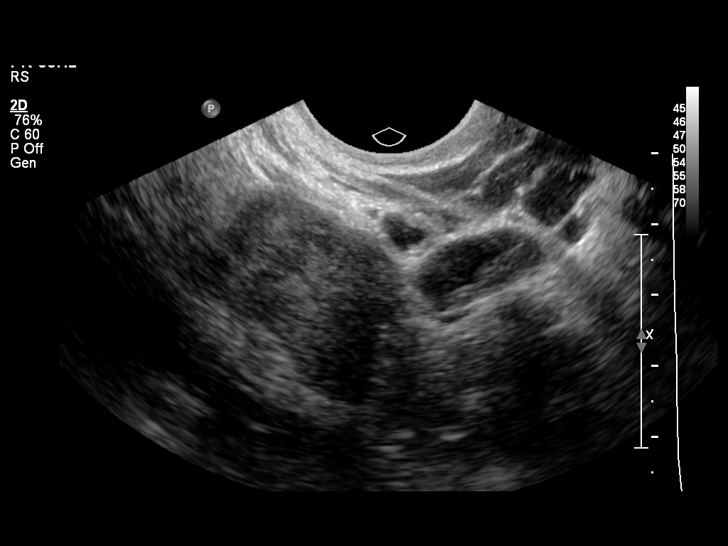
[im 37/48]
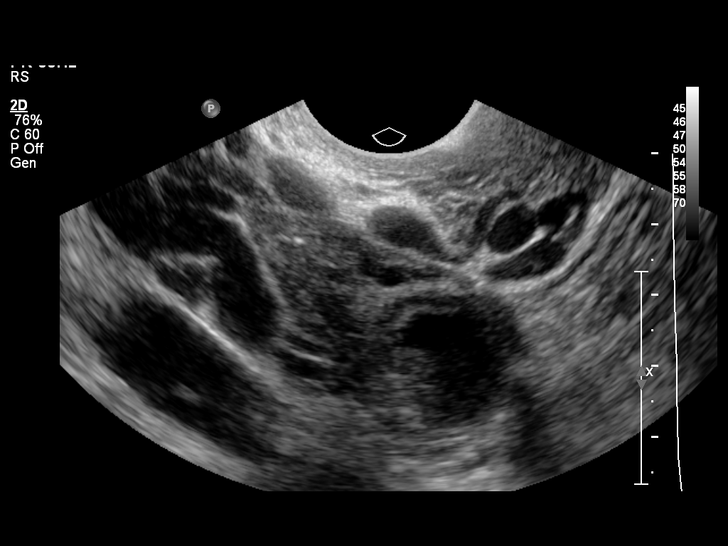
[im 41/48]
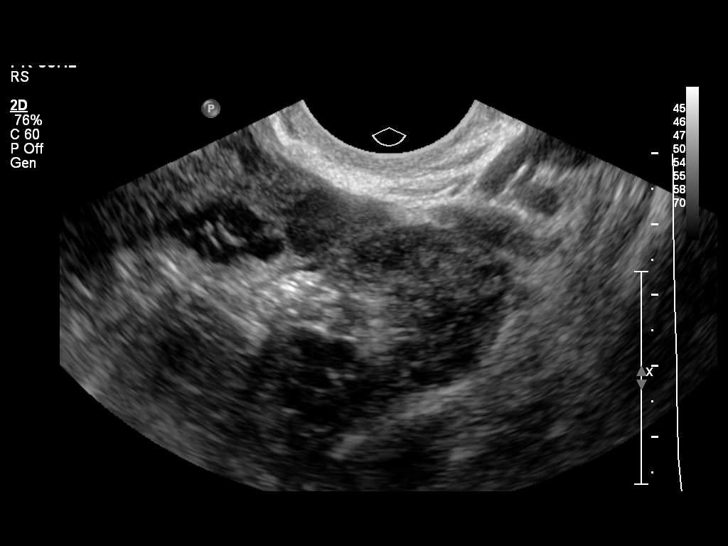
[im 44/48]
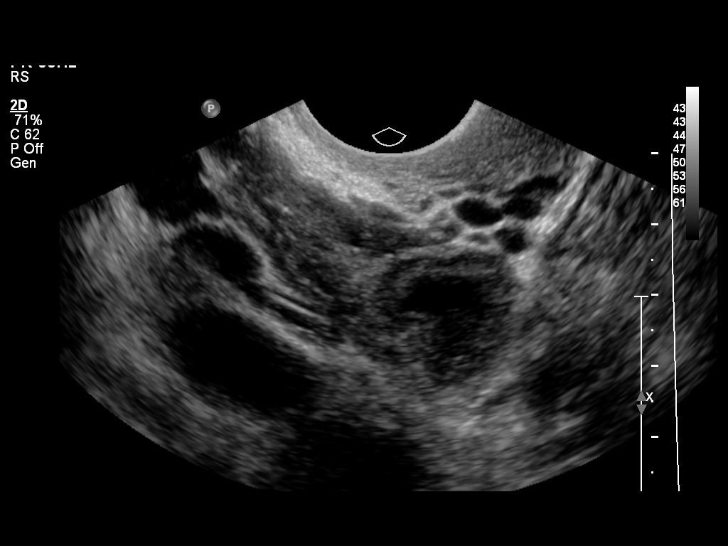
[im 48/48]
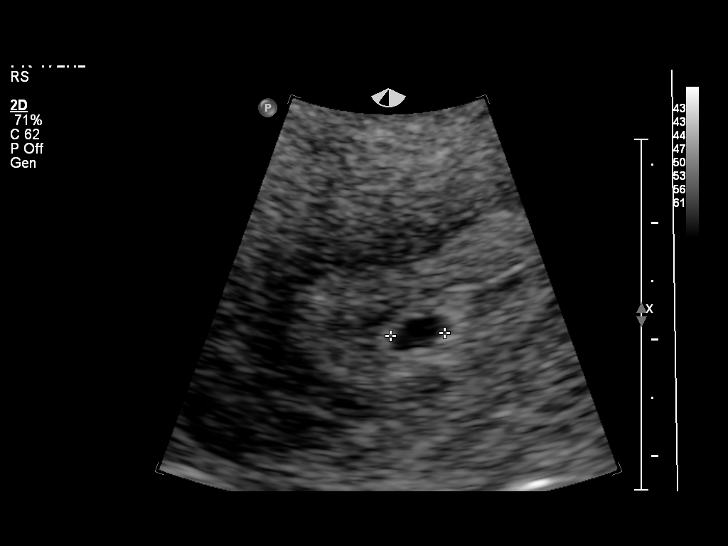

[14 of 28 positions shown; findings below may reference images not displayed]

FINDINGS: Intrauterine gestational sac: Single

Yolk sac:  Probable

Embryo:  Not visualized

Cardiac Activity: Not visualized

MSD:  46  mm   5 w   0  d

US EDC: September 26, 2013

Maternal uterus/adnexae: No subchorionic hemorrhage is demonstrated.
There are no abnormal adnexal masses demonstrated.
IMPRESSION: There may be a very early gestational sac present but no fetal pole
is demonstrated currently. The presence of a yolk sac is
indeterminate. No suspicious adnexal masses are demonstrated.

Serial follow-up beta HCG determinations and pelvic ultrasound
examinations will be needed.

## 2015-05-02 ENCOUNTER — Ambulatory Visit (INDEPENDENT_AMBULATORY_CARE_PROVIDER_SITE_OTHER): Payer: Managed Care, Other (non HMO) | Admitting: Family Medicine

## 2015-05-02 ENCOUNTER — Encounter: Payer: Self-pay | Admitting: Family Medicine

## 2015-05-02 VITALS — BP 118/68 | HR 72 | Ht 72.0 in | Wt 215.4 lb

## 2015-05-02 DIAGNOSIS — Z7689 Persons encountering health services in other specified circumstances: Secondary | ICD-10-CM

## 2015-05-02 DIAGNOSIS — Z7189 Other specified counseling: Secondary | ICD-10-CM

## 2015-05-02 DIAGNOSIS — R635 Abnormal weight gain: Secondary | ICD-10-CM | POA: Diagnosis not present

## 2015-05-02 DIAGNOSIS — Z713 Dietary counseling and surveillance: Secondary | ICD-10-CM

## 2015-05-02 LAB — COMPREHENSIVE METABOLIC PANEL
ALT: 9 U/L (ref 6–29)
AST: 15 U/L (ref 10–30)
Albumin: 4.2 g/dL (ref 3.6–5.1)
Alkaline Phosphatase: 38 U/L (ref 33–115)
BILIRUBIN TOTAL: 0.4 mg/dL (ref 0.2–1.2)
BUN: 14 mg/dL (ref 7–25)
CALCIUM: 9 mg/dL (ref 8.6–10.2)
CO2: 26 mmol/L (ref 20–31)
Chloride: 104 mmol/L (ref 98–110)
Creat: 0.69 mg/dL (ref 0.50–1.10)
Glucose, Bld: 86 mg/dL (ref 65–99)
Potassium: 3.7 mmol/L (ref 3.5–5.3)
SODIUM: 139 mmol/L (ref 135–146)
Total Protein: 7 g/dL (ref 6.1–8.1)

## 2015-05-02 LAB — CBC WITH DIFFERENTIAL/PLATELET
BASOS ABS: 0 10*3/uL (ref 0.0–0.1)
Basophils Relative: 0 % (ref 0–1)
Eosinophils Absolute: 0.1 10*3/uL (ref 0.0–0.7)
Eosinophils Relative: 1 % (ref 0–5)
HCT: 40.4 % (ref 36.0–46.0)
Hemoglobin: 13.3 g/dL (ref 12.0–15.0)
LYMPHS ABS: 2.9 10*3/uL (ref 0.7–4.0)
Lymphocytes Relative: 39 % (ref 12–46)
MCH: 30.9 pg (ref 26.0–34.0)
MCHC: 32.9 g/dL (ref 30.0–36.0)
MCV: 94 fL (ref 78.0–100.0)
MPV: 8.9 fL (ref 8.6–12.4)
Monocytes Absolute: 0.3 10*3/uL (ref 0.1–1.0)
Monocytes Relative: 4 % (ref 3–12)
NEUTROS PCT: 56 % (ref 43–77)
Neutro Abs: 4.2 10*3/uL (ref 1.7–7.7)
Platelets: 295 10*3/uL (ref 150–400)
RBC: 4.3 MIL/uL (ref 3.87–5.11)
RDW: 13.5 % (ref 11.5–15.5)
WBC: 7.5 10*3/uL (ref 4.0–10.5)

## 2015-05-02 LAB — HEMOGLOBIN A1C
Hgb A1c MFr Bld: 5.4 % (ref <5.7)
MEAN PLASMA GLUCOSE: 108 mg/dL (ref <117)

## 2015-05-02 NOTE — Progress Notes (Signed)
   Subjective:    Patient ID: Carmen Castro, female    DOB: 10-22-1980, 34 y.o.   MRN: [id number]  HPI  She is here to establish primary care. Has not had a primary care provider in the past few years. States she has been seen OB/GYN history has been pregnant twice in the past 2 and half years. She moved here 2 years ago. Is here today for acute visit and requesting weight loss help. After delivery in March she lost 25 lbs and has gained 10 lbs back.  Last labs during pregnancy.  Has not seen a nutritionist and is not interested in seeing one. She states she has been working very hard at eating healthy and exercising and is unable to lose weight. In fact she states she has been gaining weight in spite of doing these things. She states coming here and requesting medication as a last resort for her because she knows that weight loss is a lifestyle problem however she feels like she needs a boost.   Has been pregnant for past 2 years and gained approximately 50 lbs with her first child and had no difficulty losing her pregnancy weight. States she had greater difficulty losing the weight after her second delivery. 5-6 days per week elliptical and weight training, spin class, pilates.  "Hungry all the time" eating 2000-2200 calories per day.   Weight watchers- states she gained weight while doing this. Has tried using my fitness pal in past.   Has 2 kids, 2 years, and 7 month. Is no longer breast feeding.  Goes to [facility]- had pap 09/2014- normal   Elevated blood pressure during pregnancies. States otherwise blood pressures normal. No gestational diabetes.   Probiotic daily- constipation and thinks it is tied to monthly cycles. Has used Miralax in past.  Zyrtec- saw allergist, has environmental allergies.  Zoloft- started on it after last birth, had PPD- "rage" and is doing well on this.   Drinks daily glass of wine 6 ounces.  Does not smoke, denies drug.   Allergies, medications,  past medical, surgical, and social history.   Review of Systems Pertinent positives and negatives in the history of present illness.    Objective:   Physical Exam  Alert and oriented and in no acute distress. Not otherwise examined.     Assessment & Plan:  Encounter for weight loss counseling - Plan: CBC with Differential/Platelet, Comprehensive metabolic panel, TSH, Hemoglobin A1c  Encounter to establish care  Weight gain - Plan: CBC with Differential/Platelet, Comprehensive metabolic panel, TSH, Hemoglobin A1c  Discussed that we will check blood work to rule out any underlying physiologic etiology keeping her from losing weight. I would like to get her records from her OB/GYN. Discussed that medication for weight loss has risks and side effects and she must be aware of those. Discussed that her history of hypertension during pregnancy is a concern in regards to weight loss medicine. Strongly encouraged her to schedule appointment with the nutritionist since she has not done this in the past however she refuses to do this. She states she feels like she knows how to eat healthy. She will follow up in 2 weeks and we will once again discuss weight loss medication and potentially start one of the medications at that time.  Spent a minimum of 30 minutes face-to-face with patient and 50% of that was in counseling.

## 2015-05-02 NOTE — Patient Instructions (Signed)

## 2015-05-03 LAB — TSH: TSH: 1.751 u[IU]/mL (ref 0.350–4.500)

## 2015-05-14 ENCOUNTER — Encounter: Payer: Self-pay | Admitting: Family Medicine

## 2015-05-14 ENCOUNTER — Ambulatory Visit (INDEPENDENT_AMBULATORY_CARE_PROVIDER_SITE_OTHER): Payer: Managed Care, Other (non HMO) | Admitting: Family Medicine

## 2015-05-14 VITALS — BP 118/78 | HR 65 | Ht 72.0 in | Wt 214.4 lb

## 2015-05-14 DIAGNOSIS — F418 Other specified anxiety disorders: Secondary | ICD-10-CM

## 2015-05-14 DIAGNOSIS — E663 Overweight: Secondary | ICD-10-CM | POA: Diagnosis not present

## 2015-05-14 DIAGNOSIS — Z7689 Persons encountering health services in other specified circumstances: Secondary | ICD-10-CM

## 2015-05-14 MED ORDER — ALPRAZOLAM 0.25 MG PO TABS: 0.2500 mg | ORAL_TABLET | ORAL | Status: AC | PRN

## 2015-05-14 MED ORDER — PHENTERMINE HCL 37.5 MG PO CAPS: 37.5000 mg | ORAL_CAPSULE | ORAL | Status: DC

## 2015-05-14 NOTE — Progress Notes (Signed)
   Subjective:    Patient ID: Carmen Castro, female    DOB: 06/26/1980, 34 y.o.   MRN: [id number]  HPI Chief Complaint  Patient presents with  . follow-up    follow-up on weight.   She is here for weight loss medication.  She has been exercising and eating approximate 2000 cal per day. She has attempted weight loss by using Weight Watchers and my fitness pal.  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  She is also tapering off Zoloft now and is doing well with this. 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   Review of Systems  pertinent positives and negatives in the history of present illness.    Objective:   Physical Exam  Alert and in no distress. Cardiac exam shows a regular sinus rhythm without murmurs or gallops. Lungs are clear to auscultation.  EKG baseline obtained. NSR. Dr. Susann GivensLalonde also reviewed.     Assessment & Plan:  Overweight (BMI 25.0-29.9)  Situational anxiety  Encounter for new medication prescription - Plan: EKG 12-Lead   Reviewed lab results, all were normal from her lab visit. Phentermine prescribed and Instructions provided for weight loss medication including continued diet and exercise and that this is a short-term medication intended to suppress appetite but in no way is a long term deal. Emphasized healthy lifestyle. Discussed potential side effects and risks of weight loss medication  Including chest pain, new-onset hypertension, headaches, difficulty sleeping.  Baseline EKG obtained.   prescription given to patient for Xanax for flight anxiety. Instructions provided to not drink alcohol with this medication. She will continue to wean off the Zoloft and let me know if any issues with this.  Follow up in 1 month for weight check or sooner if needed.  A minimum of 25 minutes spent face to face with patient and 50% of that was in  counseling and coordination of care.

## 2015-05-16 ENCOUNTER — Ambulatory Visit: Payer: Managed Care, Other (non HMO) | Admitting: Family Medicine

## 2015-05-25 ENCOUNTER — Encounter: Payer: Self-pay | Admitting: Family Medicine

## 2015-06-13 ENCOUNTER — Encounter: Payer: Self-pay | Admitting: Family Medicine

## 2015-06-13 ENCOUNTER — Ambulatory Visit (INDEPENDENT_AMBULATORY_CARE_PROVIDER_SITE_OTHER): Payer: Managed Care, Other (non HMO) | Admitting: Family Medicine

## 2015-06-13 VITALS — BP 122/80 | HR 74 | Resp 14 | Ht 72.0 in | Wt 203.8 lb

## 2015-06-13 DIAGNOSIS — R634 Abnormal weight loss: Secondary | ICD-10-CM

## 2015-06-13 DIAGNOSIS — T50905A Adverse effect of unspecified drugs, medicaments and biological substances, initial encounter: Principal | ICD-10-CM

## 2015-06-13 MED ORDER — PHENTERMINE HCL 37.5 MG PO CAPS: 37.5000 mg | ORAL_CAPSULE | ORAL | Status: DC

## 2015-06-13 NOTE — Progress Notes (Signed)
   Subjective:    Patient ID: Carmen Castro, female    DOB: 10/16/1980, 34 y.o.   MRN: [id number]  HPI Chief Complaint  Patient presents with  . Follow-up   She is here for follow up on phentermine and weight. She has lost 11 lbs since her last visit 1 month ago.  She is experiencing some mild dry mouth but states this is not bothersome. She reports taking the medication 8:30 AM and is sleeping well. She denies having any other issues or concerns with the medication. Denies difficulty sleeping, chest pain, palpitations, or feeling jittery.  States she feels like medication is working and is helping her achieve good portion control due to decreased appetite and good energy level.  She recently had a skin procedure, mole removal, at her dermatologist that required a stitch to her upper back. She has not been exercising since this per recommendation from dermatologist.    Reviewed allergies, medications, past medical and social history.  Review of Systems Pertinent positives and negatives in the history of present illness.    Objective:   Physical Exam BP 122/80 mmHg  Pulse 74  Resp 14  Ht 6' (1.829 m)  Wt 203 lb 12.8 oz (92.443 kg)  BMI 27.63 kg/m2  Alert and in no distress.  Cardiac exam shows a regular sinus rhythm without murmurs or gallops. Lungs are clear to auscultation.     Assessment & Plan:  Weight loss due to medication  She appears to be doing well on the Phentermine and is pleased with weight loss. Weight loss of approximately 5% documented.  Medication refilled. She will continue eating healthy diet with portion control, start exercising again once stitches are removed on upper back and return in 1 month for weight check.

## 2015-07-11 ENCOUNTER — Ambulatory Visit (INDEPENDENT_AMBULATORY_CARE_PROVIDER_SITE_OTHER): Payer: Managed Care, Other (non HMO) | Admitting: Family Medicine

## 2015-07-11 ENCOUNTER — Encounter: Payer: Self-pay | Admitting: Family Medicine

## 2015-07-11 VITALS — BP 116/64 | HR 64 | Ht 72.0 in | Wt 197.0 lb

## 2015-07-11 DIAGNOSIS — N946 Dysmenorrhea, unspecified: Secondary | ICD-10-CM

## 2015-07-11 DIAGNOSIS — Z09 Encounter for follow-up examination after completed treatment for conditions other than malignant neoplasm: Secondary | ICD-10-CM

## 2015-07-11 MED ORDER — PHENTERMINE HCL 15 MG PO CAPS: 15.0000 mg | ORAL_CAPSULE | ORAL | Status: DC

## 2015-07-11 MED ORDER — IBUPROFEN 800 MG PO TABS: 800.0000 mg | ORAL_TABLET | Freq: Three times a day (TID) | ORAL | Status: DC | PRN

## 2015-07-11 NOTE — Patient Instructions (Addendum)
We will taper the dose of phentermine over the next 10 days and then stop the medication. Remember that lifestyle modification is the foundation for maintaining weight. Calories in versus calories out.

## 2015-07-11 NOTE — Progress Notes (Signed)
   Subjective:    Patient ID: Carmen Castro, female    DOB: Jun 17, 1980, 35 y.o.   MRN: [id number]  HPI Chief Complaint  Patient presents with  . Follow-up    follow-up on weight   She is here for follow-up on weight loss medication. She has been taking phentermine 37.5 mg for the past 2 months. At her 1 month follow-up she had lost 11 pounds and was pleased with the effects of the medication. She has lost an additional 6 pounds in the past month. She is ready to wean off of the medication. Denies any problems. No concerns today. Denies chest pain, palpitations. Reports diet healthy with good protein intake,she is doing a 21 day workout. She states she enjoys exercising. She had normal blood work November 2016.  She reports difficulties with menstrual cramping, this is not new and she takes 800 mg ibuprofen prior to administration and this helps her. She is requesting a refill on this.  She reports history of psoriasis and has an appointment with the rheumatologist in February.   Review of Systems Pertinent positives and negatives in the history of present illness.    Objective:   Physical Exam BP 116/64 mmHg  Pulse 64  Ht 6' (1.829 m)  Wt 197 lb (89.359 kg)  BMI 26.71 kg/m2  Alert and oriented and in no acute distress. Not otherwise examined.     Assessment & Plan:  Follow up  Menstrual cramps - Plan: ibuprofen (ADVIL,MOTRIN) 800 MG tablet  Congratulated her on weight loss and lifestyle modifications. She will wean off of phentermine over the next 10 days. 15 mg prescription sent to pharmacy. Discussed that healthy diet and physical activity is the key to maintaining her weight. Calories in versus calories out. She states she is motivated to keep the weight off. Ibuprofen 800 mg prescription sent to pharmacy for her to use when necessary for menstrual cramps. Discussed that she is up-to-date on Pap smear and blood work. She will follow up as needed or in approximately one year for  a physical exam.

## 2015-08-20 ENCOUNTER — Ambulatory Visit (INDEPENDENT_AMBULATORY_CARE_PROVIDER_SITE_OTHER): Payer: Managed Care, Other (non HMO) | Admitting: Family Medicine

## 2015-08-20 ENCOUNTER — Encounter: Payer: Self-pay | Admitting: Family Medicine

## 2015-08-20 VITALS — BP 118/76 | HR 64 | Temp 98.0°F | Wt 196.6 lb

## 2015-08-20 DIAGNOSIS — F411 Generalized anxiety disorder: Secondary | ICD-10-CM

## 2015-08-20 DIAGNOSIS — R059 Cough, unspecified: Secondary | ICD-10-CM

## 2015-08-20 DIAGNOSIS — R05 Cough: Secondary | ICD-10-CM | POA: Diagnosis not present

## 2015-08-20 MED ORDER — AZITHROMYCIN 250 MG PO TABS: ORAL_TABLET | ORAL | Status: AC

## 2015-08-20 MED ORDER — ALBUTEROL SULFATE HFA 108 (90 BASE) MCG/ACT IN AERS
2.0000 | INHALATION_SPRAY | Freq: Four times a day (QID) | RESPIRATORY_TRACT | Status: AC | PRN
Start: 2015-08-20 — End: ?

## 2015-08-20 MED ORDER — SERTRALINE HCL 50 MG PO TABS: 50.0000 mg | ORAL_TABLET | Freq: Every day | ORAL | Status: AC

## 2015-08-20 MED ORDER — BENZONATATE 200 MG PO CAPS: 200.0000 mg | ORAL_CAPSULE | Freq: Two times a day (BID) | ORAL | Status: AC | PRN

## 2015-08-20 NOTE — Progress Notes (Signed)
   Subjective:    Patient ID: Carmen Castro, female    DOB: May 23, 1981, 35 y.o.   MRN: [id number]  HPI Chief Complaint  Patient presents with  . cough    cough for a month, taking zyrtec and allergra- tried otc meds and hydrocodone cough syrup but not helping. having some URI   She is here with complaints of cough for 1 month, has been productive of green thick mucous in past few days. States cough is keeping her awake at night.  Has tried mucinex DM and Hycodan without relief. States she has seasonal allergies and has taken daily Zyrtec for at least one year and switched to [place] about a week ago.   States she is concerned mostly about cough but also started feeling more fatigue, sinus pressure, and ear popping yesterday.   Denies fever, chills, nausea, vomiting, diarrhea.  Does not smoke. No recent antibiotic use. Positive sick contacts.  History of bronchitis 9 years ago, pneumonia in high school.   She is also requesting refill of zoloft, states she has been stable on the same dose for several years. Initially started on this medication by her OB for post partum rage symptoms and considered weaning off but since she feels good on medicine would like to continue on it.    Review of Systems Pertinent positives and negatives in the history of present illness.     Objective:   Physical Exam BP 118/76 mmHg  Pulse 64  Temp(Src) 98 F (36.7 C) (Oral)  Wt 196 lb 9.6 oz (89.177 kg)  Alert and in no distress. No sinus tenderness.  Fluid to bilateral  tympanic membranes, canals are normal. Pharyngeal area is erythematous, with a tonsil stone to right tonsil, otherwise normal. Neck is supple with left anterior cervical adenopathy. Cardiac exam shows a regular sinus rhythm without murmurs or gallops. Lungs are clear to auscultation.      Assessment & Plan:  Cough - Plan: benzonatate (TESSALON) 200 MG capsule, azithromycin (ZITHROMAX Z-PAK) 250 MG tablet, albuterol (PROVENTIL HFA;VENTOLIN  HFA) 108 (90 Base) MCG/ACT inhaler, DG Chest 2 View  Generalized anxiety disorder - Plan: sertraline (ZOLOFT) 50 MG tablet  Will get a chest XR to rule out underlying pneumonia or lung disease. Z-pak, Tessalon and albuterol inhaler sent to pharmacy with instructions for use. She has used inhaler in past when she had pneumonia.  She will follow up if not back to baseline after completing the antibiotic. Recommend treating her allergies with allegra and flonase. Will consider GERD if cough persists after treatment.  Refilled Zoloft per patient request. She is doing well on this medication.

## 2015-08-20 NOTE — Patient Instructions (Signed)
Take the Z-pak and if you are not completely better after finishing the antibiotic, let me know.  Try the Tessalon, Inhaler as needed or cough.  Try Flonase and continue on Allegra or Zyrtec  Cough, Adult Coughing is a reflex that clears your throat and your airways. Coughing helps to heal and protect your lungs. It is normal to cough occasionally, but a cough that happens with other symptoms or lasts a long time may be a sign of a condition that needs treatment. A cough may last only 2-3 weeks (acute), or it may last longer than 8 weeks (chronic). CAUSES Coughing is commonly caused by:  Breathing in substances that irritate your lungs.  A viral or bacterial respiratory infection.  Allergies.  Asthma.  Postnasal drip.  Smoking.  Acid backing up from the stomach into the esophagus (gastroesophageal reflux).  Certain medicines.  Chronic lung problems, including COPD (or rarely, lung cancer).  Other medical conditions such as heart failure. HOME CARE INSTRUCTIONS  Pay attention to any changes in your symptoms. Take these actions to help with your discomfort:  Take medicines only as told by your health care provider.  If you were prescribed an antibiotic medicine, take it as told by your health care provider. Do not stop taking the antibiotic even if you start to feel better.  Talk with your health care provider before you take a cough suppressant medicine.  Drink enough fluid to keep your urine clear or pale yellow.  If the air is dry, use a cold steam vaporizer or humidifier in your bedroom or your home to help loosen secretions.  Avoid anything that causes you to cough at work or at home.  If your cough is worse at night, try sleeping in a semi-upright position.  Avoid cigarette smoke. If you smoke, quit smoking. If you need help quitting, ask your health care provider.  Avoid caffeine.  Avoid alcohol.  Rest as needed. SEEK MEDICAL CARE IF:   You have new  symptoms.  You cough up pus.  Your cough does not get better after 2-3 weeks, or your cough gets worse.  You cannot control your cough with suppressant medicines and you are losing sleep.  You develop pain that is getting worse or pain that is not controlled with pain medicines.  You have a fever.  You have unexplained weight loss.  You have night sweats. SEEK IMMEDIATE MEDICAL CARE IF:  You cough up blood.  You have difficulty breathing.  Your heartbeat is very fast.   This information is not intended to replace advice given to you by your health care provider. Make sure you discuss any questions you have with your health care provider.   Document Released: 11/29/2010 Document Revised: 02/21/2015 Document Reviewed: 08/09/2014 Elsevier Interactive Patient Education [employer]

## 2015-08-21 ENCOUNTER — Ambulatory Visit
Admission: RE | Admit: 2015-08-21 | Discharge: 2015-08-21 | Disposition: A | Discharge status: Home / Self Care | Payer: Managed Care, Other (non HMO) | Source: Ambulatory Visit | Attending: Family Medicine | Admitting: Family Medicine

## 2015-08-21 DIAGNOSIS — R05 Cough: Secondary | ICD-10-CM

## 2015-08-21 DIAGNOSIS — R059 Cough, unspecified: Secondary | ICD-10-CM

## 2015-09-07 ENCOUNTER — Other Ambulatory Visit: Payer: Self-pay | Admitting: Family Medicine

## 2015-10-10 ENCOUNTER — Other Ambulatory Visit: Payer: Self-pay | Admitting: Family Medicine

## 2015-10-10 NOTE — Telephone Encounter (Signed)
Is this okay to refill? 

## 2015-10-10 NOTE — Telephone Encounter (Signed)
?   Ok to refill Ibuprofen 800 mg?

## 2016-02-12 ENCOUNTER — Telehealth: Payer: Self-pay | Admitting: Family Medicine

## 2016-02-12 MED ORDER — FLUCONAZOLE 150 MG PO TABS: 150.0000 mg | ORAL_TABLET | Freq: Once | ORAL | 0 refills | Status: AC

## 2016-02-12 NOTE — Telephone Encounter (Signed)
Pt called back and was informed of message. She states that she is not pregnant. She has moved but is only an hour away and for right now will continue to be seen here. She also states that if this doesn't clear it up she will call for an appt.

## 2016-02-12 NOTE — Telephone Encounter (Signed)
Done and left detailed msg for pt on personal VM

## 2016-02-12 NOTE — Telephone Encounter (Signed)
Pt called and was requesting something for a yeast infection, states she is having some discharge burning,itching, states she gets them all the time, informed her you was out of the office today, pt could not come she has moved to [place] and had her children today and could not come in, pt would like it send to [facility] [address], [place], [place] [id number]  and pt can be reached at [phone]

## 2016-02-12 NOTE — Telephone Encounter (Signed)
Please send in Diflucan 150 mg x 1 dose. No refills. Ask her if she has moved permanently or is she going to still be in the area. I recommend that she be seen if this medication does not clear up her symptoms. Also, please make sure she is not pregnant as this medication is contraindicated during pregnancy.

## 2016-09-18 IMAGING — CR DG CHEST 2V
2 series · 2 of 2 positions shown · non-contrast
Comparison: No prior.

CLINICAL DATA: Productive cough.

EXAM:
CHEST  2 VIEW

[w chest pa]
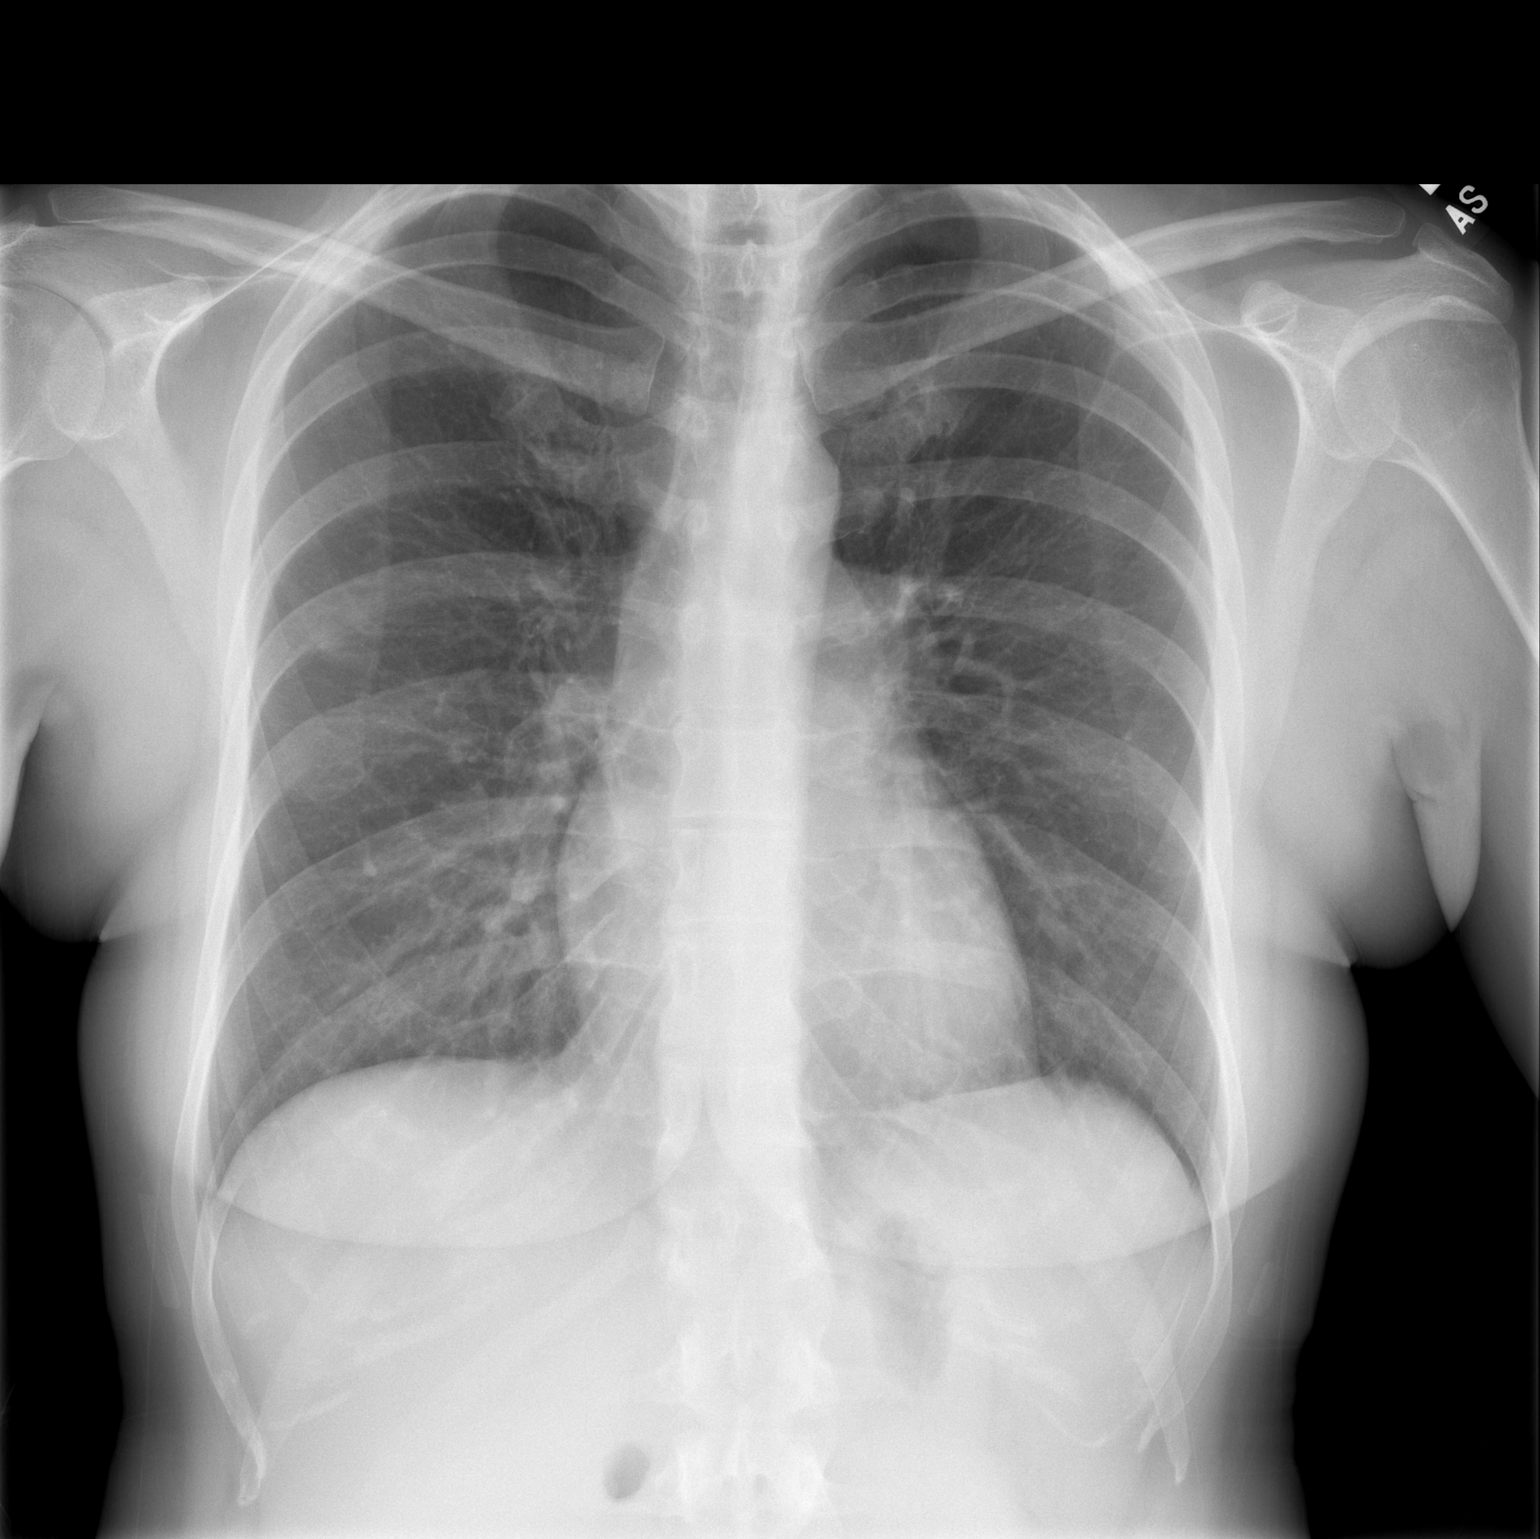

[w chest lat]
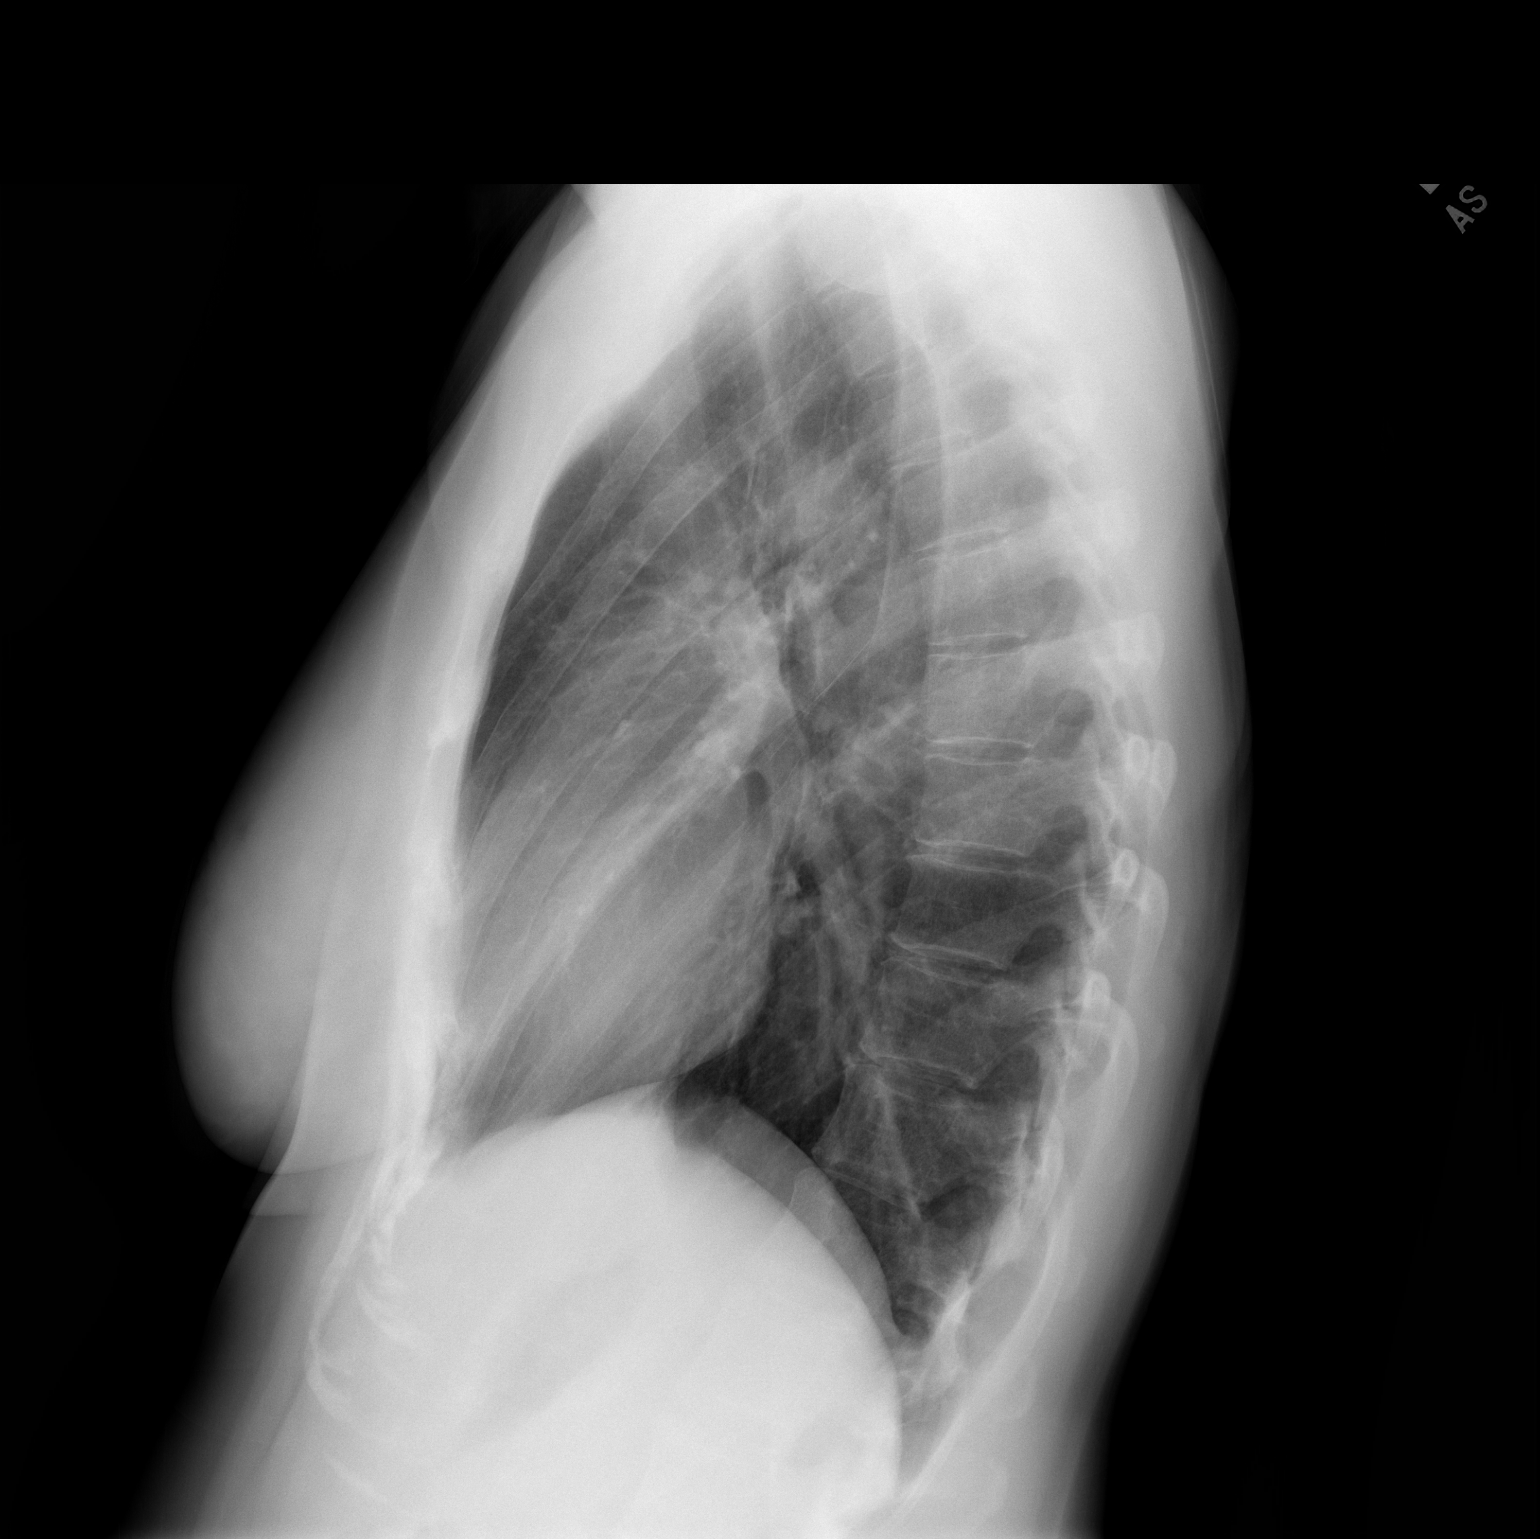

[2 of 2 positions shown; findings below may reference images not displayed]

FINDINGS: Mediastinum and hilar structures normal the lungs are clear. Heart
size normal. No pleural effusion or pneumothorax.
IMPRESSION: No acute cardiopulmonary disease.

## 2018-11-04 ENCOUNTER — Telehealth: Payer: Self-pay | Admitting: Family Medicine

## 2018-11-04 NOTE — Telephone Encounter (Signed)
Left message needs appt.
